# Patient Record
Sex: Male | Born: 1980
Health system: Southern US, Community
[De-identification: ages and names within clinical notes are randomized; demographics above are authoritative.]

## PROBLEM LIST (undated history)

## (undated) DIAGNOSIS — G8929 Other chronic pain: Secondary | ICD-10-CM

## (undated) DIAGNOSIS — M549 Dorsalgia, unspecified: Secondary | ICD-10-CM

## (undated) DIAGNOSIS — K5790 Diverticulosis of intestine, part unspecified, without perforation or abscess without bleeding: Secondary | ICD-10-CM

## (undated) HISTORY — PX: BACK SURGERY: SHX140

---

## 2000-06-09 ENCOUNTER — Encounter: Admission: RE | Admit: 2000-06-09 | Discharge: 2000-09-07 | Payer: Self-pay | Admitting: Anesthesiology

## 2001-12-17 ENCOUNTER — Emergency Department (HOSPITAL_COMMUNITY): Admission: EM | Admit: 2001-12-17 | Discharge: 2001-12-17 | Payer: Self-pay | Admitting: Emergency Medicine

## 2001-12-24 ENCOUNTER — Emergency Department (HOSPITAL_COMMUNITY): Admission: EM | Admit: 2001-12-24 | Discharge: 2001-12-24 | Payer: Self-pay | Admitting: Emergency Medicine

## 2004-03-23 ENCOUNTER — Emergency Department (HOSPITAL_COMMUNITY): Admission: EM | Admit: 2004-03-23 | Discharge: 2004-03-23 | Payer: Self-pay | Admitting: Emergency Medicine

## 2008-09-27 ENCOUNTER — Ambulatory Visit (HOSPITAL_COMMUNITY): Admission: RE | Admit: 2008-09-27 | Discharge: 2008-09-27 | Payer: Self-pay | Admitting: Pediatrics

## 2008-10-09 ENCOUNTER — Ambulatory Visit (HOSPITAL_COMMUNITY): Admission: RE | Admit: 2008-10-09 | Discharge: 2008-10-09 | Payer: Self-pay | Admitting: Pediatrics

## 2008-11-14 ENCOUNTER — Encounter (INDEPENDENT_AMBULATORY_CARE_PROVIDER_SITE_OTHER): Payer: Self-pay | Admitting: Orthopedic Surgery

## 2008-11-15 ENCOUNTER — Inpatient Hospital Stay (HOSPITAL_COMMUNITY): Admission: AD | Admit: 2008-11-15 | Discharge: 2008-11-16 | Payer: Self-pay | Admitting: Orthopedic Surgery

## 2009-05-22 IMAGING — CR DG OR PORTABLE SPINE
1 series · 1 of 1 positions shown · non-contrast
Comparison: 7188 hours the same day.

CLINICAL DATA: 27-year-old male undergoing lumbar surgery.

PORTABLE SPINE

[view not recorded]
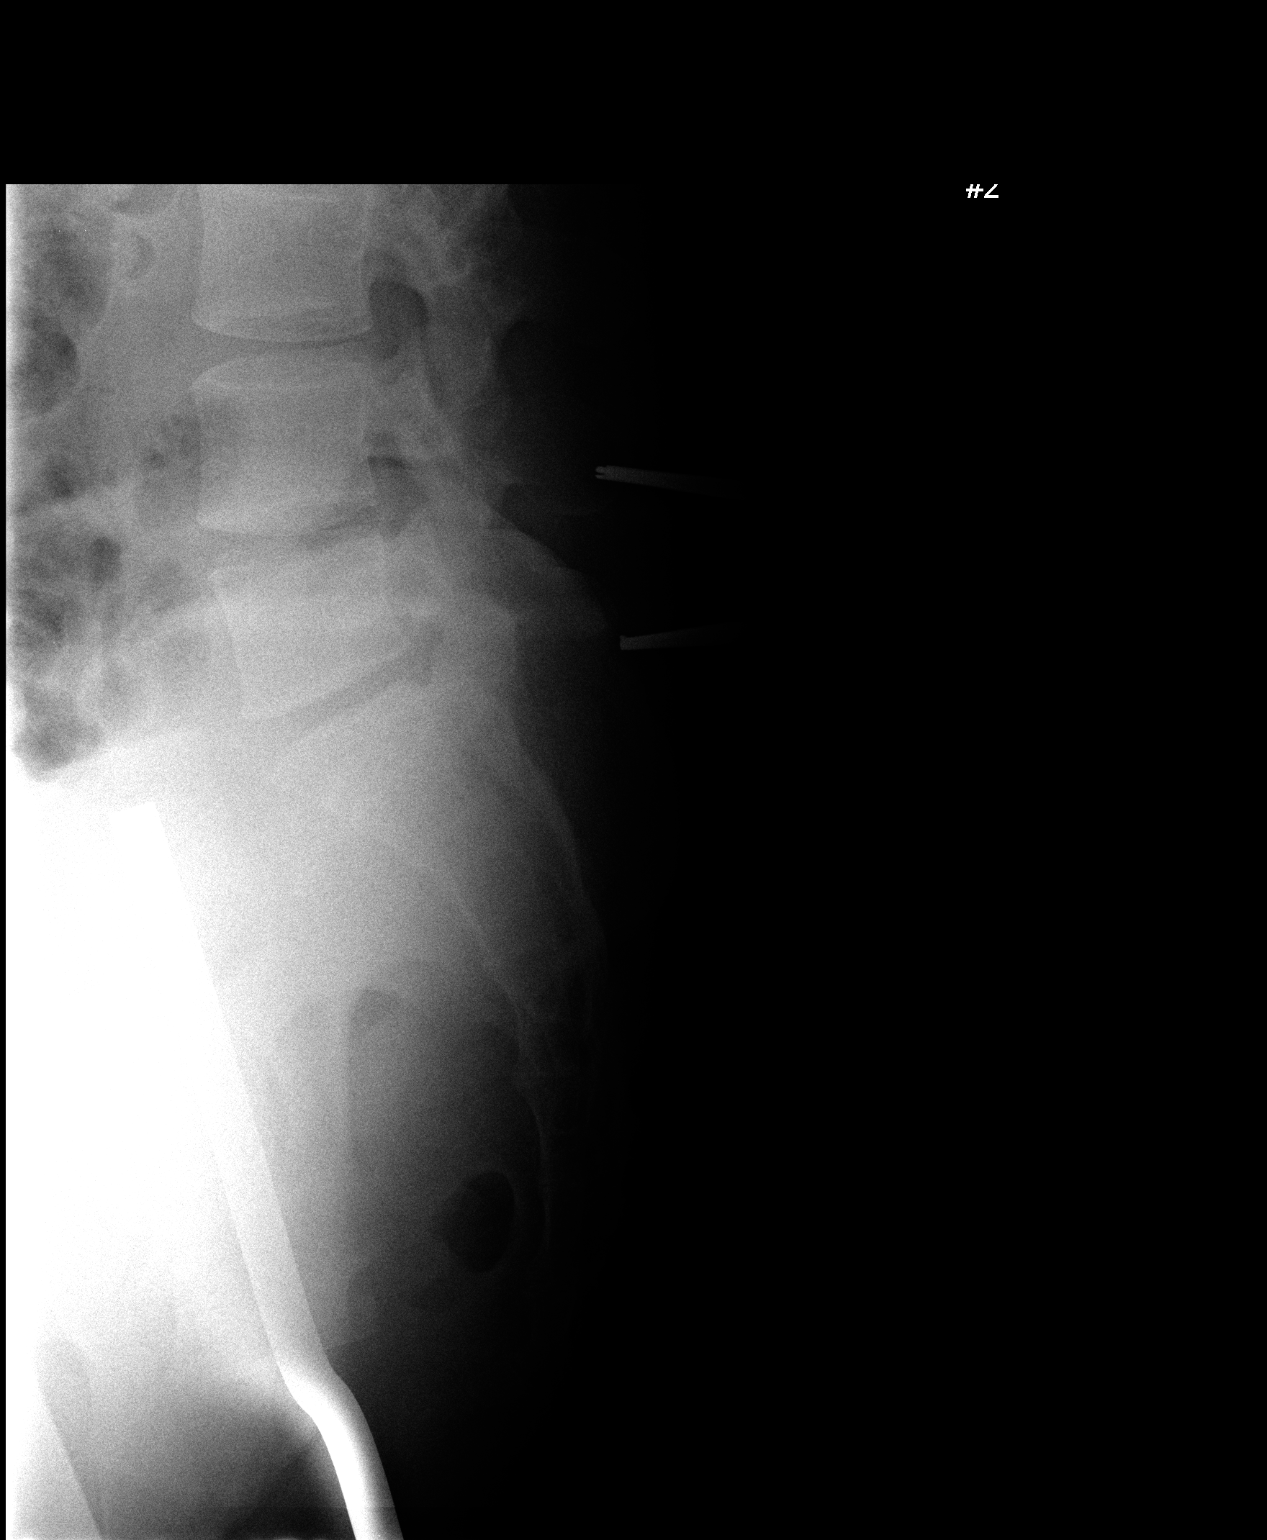

[1 of 1 positions shown; findings below may reference images not displayed]

FINDINGS: Portable cross-table lateral intraoperative view of the
lumbar spine labeled #2 at 0405 hours.

Assuming five lumbar type vertebral bodies, surgical clamps are
seen on the L4 spinous process, and near the L5 spinous process.
These approximate the L4-L5 and L5-L1 disc space levels,
respectively.
IMPRESSION: Intraoperative localization as above.

## 2009-05-22 IMAGING — CR DG OR PORTABLE SPINE
1 series · 1 of 1 positions shown · non-contrast
Comparison: None.

CLINICAL DATA: Low back pain.  Intraoperative spine film request
for annotation.

PORTABLE SPINE

[view not recorded]
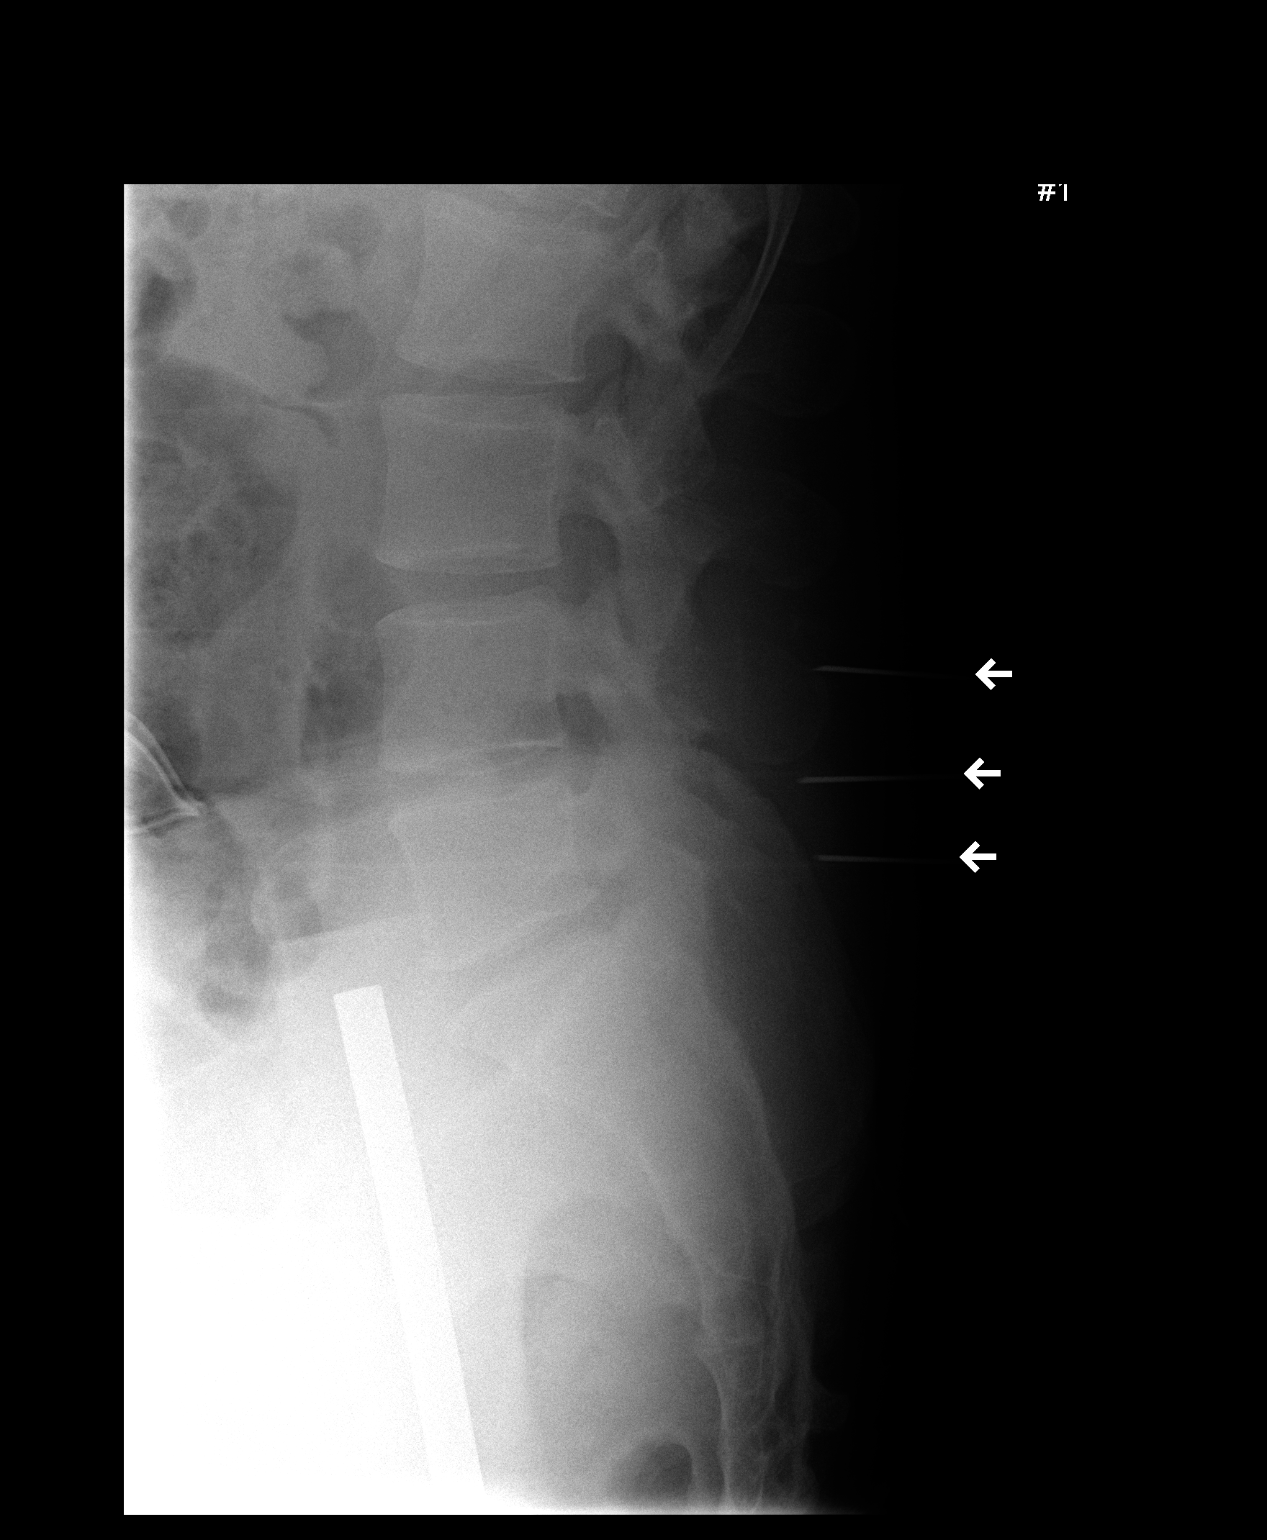

[1 of 1 positions shown; findings below may reference images not displayed]

FINDINGS: Five lumbar type vertebral bodies are assumed.  I have no
correlative cross sectional imaging or plain films.

Three needles have been placed from a posterior approach directed
toward the lower lumbar spine.

The upper needle tip is adjacent to the superior aspect of the
spinous process of L4.

The middle needle tip lies between the spinous processes of L4 and
L5.

The lower needle tip appears to be adjacent to the inferior aspect
of the spinous process of L5.
IMPRESSION: As above

## 2011-01-26 NOTE — Op Note (Signed)
[name], [name]                  ACCOUNT NO.:  [id number]   MEDICAL RECORD NO.:  [id number]          PATIENT TYPE:  OIB   LOCATION:  1617                         FACILITY:  [facility]   PHYSICIAN:  Marlowe Kays, M.D.  DATE OF BIRTH:  1981/02/05   DATE OF PROCEDURE:  11/14/2008  DATE OF DISCHARGE:                               OPERATIVE REPORT   PREOPERATIVE DIAGNOSES:  1. Herniated nucleus pulposus L4-5 left with L5 neuropathy.  2. Herniated nucleus pulposus L5-S1 right with S1 neuropathy.   OPERATION:  Microdiskectomy L4-5 left and L5-S1 right.   SURGEON:  Dr. Simonne Come.   ASSISTANT:  Dr. Worthy Rancher.   ANESTHESIA:  General.   PATHOLOGY AND JUSTIFICATION FOR PROCEDURE:  He had, had a few week  history of progressive, mainly left lower extremity pain associated with  both L5 and S1 neuropathy with an MRI on November 07, 2008,  demonstrating large disk herniations in both areas.  For some reason his  neurologic abnormalities were in the left lower extremity only, but his  MRI demonstrated a large disk herniation and L5-S1 on the right.  Also  at surgery, he was found to have numerous large fragments beneath the L5  nerve root on the left.   DESCRIPTION OF PROCEDURE:  Prophylactic antibiotics, satisfactory  general anesthesia, knee-chest position on the Andrews frame, the back  was prepped with DuraPrep and draped in a sterile field.  A timeout  performed.  With three needles and a lateral x-ray, I tentatively  located the L4 to sacrum interval.  I then made my initial incision  based on this and tagged the two spinous processes which I thought were  L4-L5, and a lateral x-ray confirmed that this was the case.  Based of  this, we dissected the soft tissue off the lamina of L4-L5 and placed  two self-retaining McCullough retractors, working first at L5-S1 on the  right.  I used a double-action rongeur to remove bone from the inferior  portion of the lamina of L5.  We then  brought in the microscope and  continued exposing the interspace.  We placed a small curette beneath  the sacrum and followed this with a 2-mm Kerrison rongeur, removing bone  and ligamentum flavum.  With a combination of 2-3 mm Kerrison rongeurs  decompressed the interspace and decompressed the interlaminar space.  The S1 nerve root was identified and was found to be quite tight.  With  then went to L4-5 and performed a similar decompression.  I then used  two Penfield IV instruments to further localize the disk spaces.  We  returned the L5-S1.  We located the L5-S1 disk and opened it with a 15  knife blade.  I removed large chunks of disk material which were very  stringy and adherent.  Following removal of all disk material from the  interspace, the S1 nerve root was freely mobile.  I irrigated the wound  well and placed Gelfoam soaked in thrombin over the interspace and  around the nerve and dura.  I then went to L4-5 again.  The  L5 nerve  root was very adherent with a large element of lateral recess stenosis.  After decompressing this enough laterally so that we were able to locate  the L4-5 interspace, I opened this with a 15 knife blade.  There was a  lot of reactive tissue present.  We obtained a small amount of disk  material.  MRI had demonstrated disk material which had been expelled  inferiorly and we looked over the body of L5 and found a large amount of  disk material, again in big chunks lying beneath the L5 nerve root.  After removing numerous fragments and doing a wide distal foraminotomy,  no further disk material was evident and the L5 nerve root was freely  mobile.  I then irrigated the wound as well here with Gelfoam.  On  closure, he had a steady ooze from no specific area and accordingly I  placed a quarter-inch Penrose drain with safety pin through the right  inferior subcutaneous tissue and carefully closed the drain under direct  visualization so as not to spear  it with suture with interrupted #1  Vicryl in the fascia and deep subcutaneous tissue, 2-0 Vicryl in the  superficial subcutaneous tissue and staples in the skin.  Betadine  Adaptic dry sterile dressing were applied.  He tolerated the procedure  well.  He was gently placed on his PACU bed and taken to the recovery in  satisfactory condition with no known complications.  Estimated blood  loss was perhaps 250 mL.  No dural tears, no known complications.           ______________________________  Marlowe Kays, M.D.     JA/MEDQ  D:  11/14/2008  T:  11/14/2008  Job:  [id number]

## 2011-01-29 NOTE — H&P (Signed)
[facility]  Patient:    STERLING, MONDO                           MRN: [id number] Adm. Date:  [id number] Attending:  Thyra Breed CC:         Julio Sicks, M.D.   History and Physical  FOLLOWUP EVALUATION  Mr. Khachatryan comes in for followup evaluation an possibly his third injection today.  He has noted no improvement since his previous two injections and has the same symptoms as when he presented.  I advised him there was really no need to have another injection today.  His medications are unchanged.  PHYSICAL EXAMINATION:  VITAL SIGNS:  Blood pressure 133/72, heart rate 73, respiratory rate 10, O2 saturation 99%, pain level 8/10, temperature 97.3.  NEUROLOGIC:  He exhibited symmetric deep tendon reflexes of the knees and ankles with negative straight leg raise signs today.  His gait is intact.  IMPRESSION:  Low back pain with history of central focal disk herniation at L4-5 and spinal stenosis with no improvement to two lumbar epidural steroid injections.  DISPOSITION:  I advised the patient there was no need to do a third injection today and recommended that he go ahead and followup with Dr. Jordan Likes. DD:  06/24/00 TD:  06/24/00 Job: [id number]

## 2011-01-29 NOTE — Procedures (Signed)
[facility]  Patient:    [name], [name]                           MRN: [id number] Proc. Date: 06/10/00 Adm. Date:  [id number] Attending:  Thyra Breed CC:         Dr. Molli Hazard  Dr. Felecia Shelling  Dr. Julio Sicks   Procedure Report  PROCEDURE:  Lumbar epidural steroid injection.  HISTORY OF PRESENT ILLNESS:  [name] is a 30 year old who is sent to [place] by Dr. Jordan Likes for a series of lumbar epidural steroid injections. The patient states that he was in his usual state of health up until approximately 7 months ago when he noted the ______ onset of lower back discomfort. With time, this pain became more diffuse radiating out into the left lower extremity across his lower back. He saw Dr. Felecia Shelling, his primary care physician, who obtained an MRI which apparently demonstrated a central disk herniation at L4-5 with moderate spinal stenosis. He was sent to Dr. Jordan Likes who evaluated him on September 13 and advocated a trial of epidural steroid injections. He presents today for this. He describes his pain as a deep throbbing, shooting, sharp, cramping discomfort radiating out into the left lower extremity predominantly. He denies any bowel or bladder incontinence although he has had some enuresis for the past 2 nights. He denied weakness. He does have persistent numbness and tingling out into the left foot involving the dorsum of the left foot to the great toe. He is currently on Tylenol and ibuprofen for this. He has been given celebrex but did not take it because somebody in his neighborhood died from celebrex. His pain is much worse by lifting or sitting for long periods of time or being stationary in one position too long. It is improved by laying on the floor with his legs flexed.  CURRENT MEDICATIONS:  Tylenol and ibuprofen.  ALLERGIES:  PENICILLIN and BEE STINGS.  FAMILY HISTORY:  Positive for diabetes, hypertension, cancer and emphysema.  PAST SURGICAL HISTORY:   Significant for urethral dilatation at age 74.  SOCIAL HISTORY:  The patient is a nonsmoker, nondrinker. He has been out of work due to his back by Dr. Felecia Shelling.  ACTIVE MEDICAL PROBLEMS:  He denied any active medical problems.  REVIEW OF SYSTEMS:  GENERAL:  Negative.  HEAD:  Significant for sinus problems and hay fever. ALLERGY:  He is allergic to bees and pollen. EYES: Negative. NOSE/MOUTH/THROAT:  Significant for nasal stuffiness. EARS:  Negative. PULMONARY: Significant for cough secondary to post nasal drip. CARDIOVASCULAR: Negative. GI: Negative. GU: Negative. MUSCULOSKELETAL: Negative. NEUROLOGIC: See HPI. HEMATOLOGIC:  Negative. ENDOCRINE: Negative. CUTANEOUS:  Negative. PSYCHIATRIC:  Negative.  PHYSICAL EXAMINATION:  VITAL SIGNS:  Blood pressure was 128/70, heart rate 70, respiratory rate 10, O2 saturations 99%, pain level is 8/10 and temperature is 98.2.  HEENT:  Head was normocephalic, atraumatic. Eyes, extraocular movements intact. Conjunctivae and sclerae clear. Nose patent nares without discharge. Oropharynx was free of lesions.  NECK:  Supple without lymphadenopathy.  LUNGS:  Clear.  HEART:  Regular rate and rhythm.  ABDOMEN:  Bowel sounds present.  GENITALIA/RECTAL:  Not performed.  BACK:  Revealed positive straight leg raise signs bilaterally at about 60 degrees. He is tender over his back over the spinous processes in the lumbar region.  EXTREMITIES:  No cyanosis, clubbing or edema with radial pulses and dorsalis pedis pulse were 2+ and symmetric.  NEUROLOGIC:  The patient  was oriented x 4. Cranial nerves II-XII are grossly intact. Deep tendon reflexes were symmetric in the upper and lower extremity with downgoing toes. Sensation exam to scratch and and vibratory sense were intact.  Coordination was grossly intact.  IMPRESSION: 1. Low back pain with history of central focal disk herniation at L4-5 of    marked spinal stenosis. 2. Hay  fever.  DISPOSITION:  I discussed the potential risks, benefits and limitations of a lumbar epidural steroid injection as well as the side effects of corticosteroids with the patient and his mother. He wishes to proceed. His questions were answered.  DESCRIPTION OF PROCEDURE:  After informed consent was obtained, the patient was placed in the sitting position and monitored. The patients back was prepped with Betadine x 3. A skin wheal was raised at the L4-5 interspace with 1 percent lidocaine. A 20 gauge Tuohy needle was introduced to the lumbar epidural space to loss of resistance to preservative free normal saline. There was no cerebrospinal fluid nor blood. 80 mg of Medrol and 7 ml of preservative free normal saline was injected. The needle was flushed with preservative free normal saline and removed intact. The patient developed some mild vagal symptoms following the injection and was laid on his left side and recuperated quickly.  CONDITION POST PROCEDURE:  Stable.  DISCHARGE INSTRUCTIONS:  Resume previous diet. Limitations in activities per instruction sheet as outlined by my assistant today. Continue on current medications. Follow-up with me in 1-2 weeks for a second injection. DD:  06/10/00 TD:  06/10/00 Job: [id number]

## 2011-01-29 NOTE — Procedures (Signed)
[facility]  Patient:    [name], [name]                           MRN: [id number] Proc. Date: 06/17/00 Adm. Date:  [id number] Attending:  Thyra Breed CC:         Julio Sicks, M.D.   Procedure Report  PROCEDURE:  Lumbar epidural steroid injection.  DIAGNOSIS:  Herniated nucleus pulposus at L4-L5 centrally with marked spinal stenosis.  INTERVAL HISTORY:  The patient has not noted a great deal of improvement after the first injection.  He felt a little bit out of it the first day, but he has just not noticed any improvement.  He continues on Tylenol and ibuprofen p.r.n.  PHYSICAL EXAMINATION:  Blood pressure 136/67, heart rate 63, respiratory rate 16, O2 saturations 98%.  Pain level is 8/10, and temperature is 98.1.  His back shows good healing from his previous injection site.  Neuro exam is grossly unchanged.  DESCRIPTION OF PROCEDURE:  After informed consent was obtained, the patient was placed in the sitting position and monitored.  His back was prepped with Betadine x 3.  A skin wheal was raised at the L4-L5 interspace with 1% lidocaine.  A 20 gauge Tuohy needle was introduced to the lumbar epidural space to loss of resistance to preservative-free normal saline.  There was no CSF nor blood.  Medrol 80 mg in 8 mL of preservative-free normal saline was gently injected.  The needle was flushed with preservative-free normal saline and removed intact.  Postprocedure condition - stable.  DISCHARGE INSTRUCTIONS: 1. Resume previous diet. 2. Limitations on activities per instruction sheet, as reviewed by my    assistant today. 3. Continue on current medications. 4. Follow up with me in 1-2 weeks for repeat injection. DD:  06/17/00 TD:  06/17/00 Job: [id number]

## 2011-12-01 ENCOUNTER — Encounter (HOSPITAL_COMMUNITY): Payer: Self-pay

## 2011-12-01 ENCOUNTER — Emergency Department (HOSPITAL_COMMUNITY)
Admission: EM | Admit: 2011-12-01 | Discharge: 2011-12-02 | Disposition: A | Discharge status: Home / Self Care | Payer: Self-pay | Attending: Emergency Medicine | Admitting: Emergency Medicine

## 2011-12-01 DIAGNOSIS — G8929 Other chronic pain: Secondary | ICD-10-CM | POA: Insufficient documentation

## 2011-12-01 DIAGNOSIS — R209 Unspecified disturbances of skin sensation: Secondary | ICD-10-CM | POA: Insufficient documentation

## 2011-12-01 DIAGNOSIS — IMO0002 Reserved for concepts with insufficient information to code with codable children: Secondary | ICD-10-CM | POA: Insufficient documentation

## 2011-12-01 DIAGNOSIS — M549 Dorsalgia, unspecified: Secondary | ICD-10-CM | POA: Insufficient documentation

## 2011-12-01 DIAGNOSIS — M5416 Radiculopathy, lumbar region: Secondary | ICD-10-CM

## 2011-12-01 HISTORY — DX: Dorsalgia, unspecified: M54.9

## 2011-12-01 HISTORY — DX: Other chronic pain: G89.29

## 2011-12-01 NOTE — ED Notes (Signed)
Chronic back pain that radiates to feet.

## 2011-12-02 ENCOUNTER — Emergency Department (HOSPITAL_COMMUNITY): Payer: Self-pay

## 2011-12-02 MED ORDER — METHOCARBAMOL 500 MG PO TABS
1000.0000 mg | ORAL_TABLET | Freq: Once | ORAL | Status: AC
  Administered 2011-12-02: 1000 mg via ORAL
  Filled 2011-12-02: qty 2

## 2011-12-02 MED ORDER — OXYCODONE-ACETAMINOPHEN 5-325 MG PO TABS: 1.0000 | ORAL_TABLET | ORAL | Status: AC | PRN

## 2011-12-02 MED ORDER — HYDROMORPHONE HCL PF 1 MG/ML IJ SOLN
1.0000 mg | Freq: Once | INTRAMUSCULAR | Status: AC
  Administered 2011-12-02: 1 mg via INTRAMUSCULAR
  Filled 2011-12-02: qty 1

## 2011-12-02 MED ORDER — PREDNISONE 10 MG PO TABS: ORAL_TABLET | ORAL | Status: DC

## 2011-12-02 MED ORDER — METHOCARBAMOL 500 MG PO TABS: 1000.0000 mg | ORAL_TABLET | Freq: Four times a day (QID) | ORAL | Status: AC

## 2011-12-02 MED ORDER — PREDNISONE 20 MG PO TABS
60.0000 mg | ORAL_TABLET | Freq: Once | ORAL | Status: AC
  Administered 2011-12-02: 60 mg via ORAL
  Filled 2011-12-02: qty 3

## 2011-12-02 NOTE — ED Notes (Signed)
Discharge instructions reviewed with pt, questions answered. Pt verbalized understanding.  

## 2011-12-02 NOTE — ED Notes (Signed)
PA at bedside to assess pt.

## 2011-12-02 NOTE — Discharge Instructions (Signed)
Lumbosacral Radiculopathy Lumbosacral radiculopathy is a pinched nerve or nerves in the low back (lumbosacral area). When this happens you may have weakness in your legs and may not be able to stand on your toes. You may have pain going down into your legs. There may be difficulties with walking normally. There are many causes of this problem. Sometimes this may happen from an injury, or simply from arthritis or boney problems. It may also be caused by other illnesses such as diabetes. If there is no improvement after treatment, further studies may be done to find the exact cause. DIAGNOSIS  X-rays may be needed if the problems become long standing. Electromyograms may be done. This study is one in which the working of nerves and muscles is studied. HOME CARE INSTRUCTIONS   Applications of ice packs may be helpful. Ice can be used in a plastic bag with a towel around it to prevent frostbite to skin. This may be used every 2 hours for 20 to 30 minutes, or as needed, while awake, or as directed by your caregiver.   Only take over-the-counter or prescription medicines for pain, discomfort, or fever as directed by your caregiver.   If physical therapy was prescribed, follow your caregiver's directions.  SEEK IMMEDIATE MEDICAL CARE IF:   You have pain not controlled with medications.   You seem to be getting worse rather than better.   You develop increasing weakness in your legs.   You develop loss of bowel or bladder control.   You have difficulty with walking or balance, or develop clumsiness in the use of your legs.   You have a fever.  MAKE SURE YOU:   Understand these instructions.   Will watch your condition.   Will get help right away if you are not doing well or get worse.  Document Released: 08/30/2005 Document Revised: 08/19/2011 Document Reviewed: 04/19/2008 [facility] Patient Information 2012 [place], [place].   Take your next dose of prednisone tomorrow evening.    Use the  the other medicines as directed.  Do not drive within 4 hours of taking oxycodone as this will make you drowsy.  Avoid lifting,  Bending,  Twisting or any other activity that worsens your pain over the next week.  Apply an  Heating pad  to your lower back for 20 minutes 4 times daily.  You should get rechecked if your symptoms are not better over the next 5 days,  Or you develop increased pain,  Weakness in your leg(s) or loss of bladder or bowel function - these are symptoms of a worsening condition.

## 2011-12-02 NOTE — ED Provider Notes (Signed)
History     CSN: [id number]  Arrival date & time 12/01/11  2148   First MD Initiated Contact with Patient 12/01/11 2323      Chief Complaint  Patient presents with  . Back Pain    (Consider location/radiation/quality/duration/timing/severity/associated sxs/prior treatment) HPI Comments: Patient presents with lower back pain which started about 3 days ago and radiates into his right heel.  He has a history of a prior discectomy surgery about 3 years ago, and has had daily low-grade discomfort, which has rapidly progressed over the past 3 days.  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  He denies any weakness in his lower extremities but describes a burning and needlelike pain that radiates down the back of his leg and ends at his right heel.  He has had no urinary or bowel incontinence or retention.  Patient is a 31 y.o. male presenting with back pain. The history is provided by the patient.  Back Pain  The pain is present in the lumbar spine. The quality of the pain is described as stabbing, shooting and burning. The pain is at a severity of 10/10. The pain is severe. The symptoms are aggravated by bending, twisting and certain positions. Associated symptoms include paresthesias and tingling. Pertinent negatives include no chest pain, no fever, no numbness, no headaches, no abdominal pain, no bowel incontinence, no perianal numbness, no bladder incontinence, no paresis and no weakness. He has tried NSAIDs for the symptoms. The treatment provided no relief.    Past Medical History  Diagnosis Date  . Back pain, chronic     Past Surgical History  Procedure Date  . Back surgery     No family history on file.  History  Substance Use Topics  . Smoking status: Never Smoker   . Smokeless tobacco: Not on file  . Alcohol Use: No      Review of Systems  Constitutional:  Negative for fever.  HENT: Negative for congestion, sore throat and neck pain.   Eyes: Negative.   Respiratory: Negative for chest tightness and shortness of breath.   Cardiovascular: Negative for chest pain.  Gastrointestinal: Negative for nausea, abdominal pain and bowel incontinence.  Genitourinary: Negative.  Negative for bladder incontinence.  Musculoskeletal: Positive for back pain. Negative for myalgias, joint swelling and arthralgias.  Skin: Negative.  Negative for rash and wound.  Neurological: Positive for tingling and paresthesias. Negative for dizziness, weakness, light-headedness, numbness and headaches.  Hematological: Negative.   Psychiatric/Behavioral: Negative.     Allergies  Bee venom; Honey; and Penicillins  Home Medications   Current Outpatient Rx  Name Route Sig Dispense Refill  . IBUPROFEN 200 MG PO TABS Oral Take 800 mg by mouth as needed. For pain    . METHOCARBAMOL 500 MG PO TABS Oral Take 2 tablets (1,000 mg total) by mouth 4 (four) times daily. 20 tablet 0  . OXYCODONE-ACETAMINOPHEN 5-325 MG PO TABS Oral Take 1 tablet by mouth every 4 (four) hours as needed for pain. 20 tablet 0  . PREDNISONE 10 MG PO TABS  6, 5, 4, 3, 2 then 1 tablet by mouth daily for 6 days total. 21 tablet 0    BP 110/75  Pulse 78  Temp(Src) 97.8 F (36.6 C) (Oral)  Resp 16  Ht 5\' 10"  (1.778 m)  Wt 243 lb 9.6 oz (110.496 kg)  BMI 34.95  kg/m2  SpO2 99%  Physical Exam  Nursing note and vitals reviewed. Constitutional: He is oriented to person, place, and time. He appears well-developed and well-nourished.  HENT:  Head: Normocephalic.  Eyes: Conjunctivae are normal.  Neck: Normal range of motion. Neck supple.  Cardiovascular: Regular rhythm and intact distal pulses.        Pedal pulses normal.  Pulmonary/Chest: Effort normal. He has no wheezes.  Abdominal: Soft. Bowel sounds are normal. He exhibits no distension and no mass.  Musculoskeletal: Normal range of motion. He  exhibits no edema.       Lumbar back: He exhibits tenderness. He exhibits no swelling, no edema and no spasm.  Neurological: He is alert and oriented to person, place, and time. He has normal strength. He displays no atrophy and no tremor. No sensory deficit. Gait normal.  Reflex Scores:      Patellar reflexes are 1+ on the right side and 1+ on the left side.      Achilles reflexes are 2+ on the right side and 2+ on the left side.      No strength deficit noted in hip and knee flexor and extensor muscle groups.  Ankle flexion and extension intact.  Patient does have increased pain with attempts at resisted extension of the knee joint.  Patellar reflexes bilaterally are reduced at 1+ but equal.  Skin: Skin is warm and dry.  Psychiatric: He has a normal mood and affect.    ED Course  Procedures (including critical care time)  Labs Reviewed - No data to display Dg Lumbar Spine Complete  12/02/2011  *RADIOLOGY REPORT*  Clinical Data: Back pain  LUMBAR SPINE - COMPLETE 4+ VIEW  Comparison: 11/14/2008  Findings:  Normal alignment of the lumbar spine.  The vertebral body heights are well preserved.  There is disc space narrowing and mild spurring at the L5-S1 level.  No fractures or subluxations identified.  IMPRESSION:  1.  No acute findings. 2.  Mild L5-S1 degenerative disc disease.  Original Report Authenticated By: Rosealee Albee, M.D.     1. Lumbar radiculopathy, acute    While in the department patient was treated with hydromorphone 1 mg IM, prednisone 60 mg by mouth and Robaxin 1 g by mouth.  He had improved pain in his lower back, but continued to have pain radiating to his right heel.  Hydromorphone 1 mg IM repeated x1 with significant improvement.  Patient was able to ambulate prior to discharge home with no antalgic gait or significant pain.   MDM  Patient with acute on chronic lumbosacral pain with new radiculopathy.  No exam findings consistent with a emergent condition.  X-rays  reviewed.  Robaxin, prednisone and Percocet prescribed.  Patient has been referred to Drs. Wynetta Emery for further evaluation and management if symptoms are not improved over the next 4-5 days with conservative treatment including minimizing of lifting bending twisting activities, heat therapy and medications per above.  He was strongly encouraged to get rechecked immediately for any weakness in his lower extremities or the development of bowel or bladder problems.        Candis Musa, PA 12/02/11 0246

## 2011-12-02 NOTE — ED Provider Notes (Signed)
Medical screening examination/treatment/procedure(s) were performed by non-physician practitioner and as supervising physician I was immediately available for consultation/collaboration.   Celene Kras, MD 12/02/11 [phone]

## 2011-12-16 ENCOUNTER — Emergency Department (HOSPITAL_COMMUNITY)
Admission: EM | Admit: 2011-12-16 | Discharge: 2011-12-16 | Disposition: A | Discharge status: Home / Self Care | Payer: Self-pay | Attending: Emergency Medicine | Admitting: Emergency Medicine

## 2011-12-16 ENCOUNTER — Encounter (HOSPITAL_COMMUNITY): Payer: Self-pay | Admitting: *Deleted

## 2011-12-16 DIAGNOSIS — M5430 Sciatica, unspecified side: Secondary | ICD-10-CM | POA: Insufficient documentation

## 2011-12-16 DIAGNOSIS — M549 Dorsalgia, unspecified: Secondary | ICD-10-CM

## 2011-12-16 MED ORDER — OXYCODONE-ACETAMINOPHEN 5-325 MG PO TABS: 1.0000 | ORAL_TABLET | ORAL | Status: AC | PRN

## 2011-12-16 MED ORDER — METHOCARBAMOL 500 MG PO TABS
1000.0000 mg | ORAL_TABLET | Freq: Once | ORAL | Status: AC
  Administered 2011-12-16: 1000 mg via ORAL
  Filled 2011-12-16: qty 2

## 2011-12-16 MED ORDER — HYDROMORPHONE HCL PF 1 MG/ML IJ SOLN
1.0000 mg | Freq: Once | INTRAMUSCULAR | Status: AC
  Administered 2011-12-16: 1 mg via INTRAMUSCULAR
  Filled 2011-12-16: qty 1

## 2011-12-16 MED ORDER — PREDNISONE 10 MG PO TABS: ORAL_TABLET | ORAL | Status: DC

## 2011-12-16 MED ORDER — CYCLOBENZAPRINE HCL 10 MG PO TABS: 10.0000 mg | ORAL_TABLET | Freq: Three times a day (TID) | ORAL | Status: AC | PRN

## 2011-12-16 NOTE — Discharge Instructions (Signed)
Sciatica  Sciatica is a condition often seen in patients with disk disease of the lower back. Pressure on the sciatica nerve causes pain to radiate from the lower back or buttock down the leg.   CAUSES   It results from pressure on nerve roots coming out of the spine. This is often the result of a disc that deteriorates and pushes to one side. Often there is a history of back problems.   TREATMENT   In most cases sciatica improves greatly with conservative treatment (treatment that does not involve surgery). Most patients are completely better after 2-4 weeks of bed rest and other supportive care. Bed rest reduces the disc pressure greatly. Sitting is the worst position. When sitting pressure on the disc is over 5 times greater than it is while lying down. Avoid:   Bending.   Lifting.   All other activities which make the problem worse.  After the pain improves, you may continue with normal activity. Take brief periods for bed rest throughout the day until you are back to normal.  Only take over-the-counter or prescription medicines for pain, discomfort, or fever as directed by your caregiver. Muscle relaxants may help by relieving spasm and providing mild sedation. Cold or heat therapy and massage may also give significant relief. Spinal manipulation is not recommended because it can increase the degree of disc protrusion. Traction can be used in severe cases. Surgery is reserved for patients who:   Do not improve within the first months of conservative treatment.   Have signs of severe nerve root pressure.  See your doctor for follow up care as recommended. A program for back injury rehabilitation with stretching and strengthening exercises is an important part of healing.   SEEK MEDICAL CARE IF:    You notice increased pain.   You notice weakness.   You have numbness in your legs.   You have any difficulty with bladder or bowel control.  Document Released: 10/07/2004 Document Revised: 08/19/2011 Document  Reviewed: 08/30/2005  [employer] Patient Information 2012 [employer].

## 2011-12-16 NOTE — ED Notes (Signed)
Low back pain chronic, , and down rt leg,  Has appt with surgeon for treatment.

## 2011-12-21 NOTE — ED Provider Notes (Signed)
History     CSN: [id number]  Arrival date & time 12/16/11  1506   First MD Initiated Contact with Patient 12/16/11 1523      Chief Complaint  Patient presents with  . Back Pain    (Consider location/radiation/quality/duration/timing/severity/associated sxs/prior treatment) Patient is a 31 y.o. male presenting with back pain. The history is provided by the patient.  Back Pain  This is a chronic problem. The current episode started more than 1 week ago. The problem occurs constantly. The problem has been gradually worsening. The pain is associated with twisting (certain movements). The pain is present in the lumbar spine. The quality of the pain is described as shooting and aching. The pain radiates to the right thigh. The pain is moderate. The symptoms are aggravated by certain positions, twisting and bending. The pain is the same all the time. Associated symptoms include leg pain. Pertinent negatives include no chest pain, no fever, no numbness, no abdominal pain, no abdominal swelling, no bowel incontinence, no perianal numbness, no bladder incontinence, no dysuria, no pelvic pain, no paresthesias, no paresis, no tingling and no weakness. He has tried NSAIDs, muscle relaxants, heat and ice for the symptoms. The treatment provided no relief.    Past Medical History  Diagnosis Date  . Back pain, chronic     Past Surgical History  Procedure Date  . Back surgery     History reviewed. No pertinent family history.  History  Substance Use Topics  . Smoking status: Never Smoker   . Smokeless tobacco: Not on file  . Alcohol Use: No      Review of Systems  Constitutional: Negative for fever.  Cardiovascular: Negative for chest pain.  Gastrointestinal: Negative for nausea, vomiting, abdominal pain and bowel incontinence.  Genitourinary: Negative for bladder incontinence, dysuria, hematuria, flank pain and pelvic pain.  Musculoskeletal: Positive for back pain and gait problem.  Negative for joint swelling.  Skin: Negative.   Neurological: Negative for dizziness, tingling, weakness, numbness and paresthesias.  All other systems reviewed and are negative.    Allergies  Bee venom; Honey; and Penicillins  Home Medications   Current Outpatient Rx  Name Route Sig Dispense Refill  . ACETAMINOPHEN 325 MG PO TABS Oral Take 1,300 mg by mouth once as needed. For pain    . CYCLOBENZAPRINE HCL 10 MG PO TABS Oral Take 1 tablet (10 mg total) by mouth 3 (three) times daily as needed for muscle spasms. 21 tablet 0  . OXYCODONE-ACETAMINOPHEN 5-325 MG PO TABS Oral Take 1 tablet by mouth every 4 (four) hours as needed for pain. 15 tablet 0  . PREDNISONE 10 MG PO TABS  Take 6 tablets day one, 5 tablets day two, 4 tablets day three, 3 tablets day four, 2 tablets day five, then 1 tablet day six 21 tablet 0    BP 135/96  Pulse 87  Temp(Src) 97.8 F (36.6 C) (Oral)  Resp 20  Ht 5\' 9"  (1.753 m)  Wt 240 lb (108.863 kg)  BMI 35.44 kg/m2  SpO2 99%  Physical Exam  Nursing note and vitals reviewed. Constitutional: He is oriented to person, place, and time. He appears well-developed and well-nourished. No distress.  HENT:  Head: Normocephalic and atraumatic.  Cardiovascular: Normal rate, regular rhythm, normal heart sounds and intact distal pulses.   No murmur heard. Pulmonary/Chest: Effort normal and breath sounds normal. No respiratory distress.  Abdominal: Soft. He exhibits no distension. There is no tenderness.  Musculoskeletal: Normal range of motion. He  exhibits no tenderness.       Lumbar back: He exhibits tenderness. He exhibits normal range of motion, no bony tenderness, no swelling, no laceration and normal pulse.       Back:  Neurological: He is alert and oriented to person, place, and time. No sensory deficit. He exhibits normal muscle tone. Coordination normal.  Reflex Scores:      Patellar reflexes are 2+ on the right side and 2+ on the left side.      Achilles  reflexes are 2+ on the right side and 2+ on the left side. Skin: Skin is warm and dry.    ED Course  Procedures (including critical care time)    1. Back pain   2. Sciatica       MDM    Previous ED charts and nursing notes were reviewed by me.    Pt has ttp of the lumbar paraspinal muscles.  No focal neuro deficits on exam.  Ambulates with a slow, but steady gait  Pt has appt with a neurosurgeon, IM dilaudid and muscle relaxer given , pt agrees to close follow-up.  Advised that ER cannot manage his chronic pain.    Patient / Family / Caregiver understand and agree with initial ED impression and plan with expectations set for ED visit. Pt stable in ED with no significant deterioration in condition. Pt feels improved after observation and/or treatment in ED.      [name], [place] 12/21/11 1357

## 2012-01-08 NOTE — ED Provider Notes (Signed)
Evaluation and management procedures were performed by the PA/NP/resident physician under my supervision/collaboration.   Treena Cosman D Jasdeep Kepner, MD 01/08/12 2010 

## 2013-07-25 ENCOUNTER — Encounter (HOSPITAL_COMMUNITY): Payer: Self-pay | Admitting: Emergency Medicine

## 2013-07-25 ENCOUNTER — Emergency Department (HOSPITAL_COMMUNITY)
Admission: EM | Admit: 2013-07-25 | Discharge: 2013-07-25 | Disposition: A | Discharge status: Home / Self Care | Payer: No Typology Code available for payment source | Attending: Emergency Medicine | Admitting: Emergency Medicine

## 2013-07-25 DIAGNOSIS — L255 Unspecified contact dermatitis due to plants, except food: Secondary | ICD-10-CM | POA: Insufficient documentation

## 2013-07-25 DIAGNOSIS — Z880 Allergy status to penicillin: Secondary | ICD-10-CM | POA: Insufficient documentation

## 2013-07-25 DIAGNOSIS — G8929 Other chronic pain: Secondary | ICD-10-CM | POA: Insufficient documentation

## 2013-07-25 MED ORDER — DIPHENHYDRAMINE HCL 25 MG PO CAPS
25.0000 mg | ORAL_CAPSULE | Freq: Once | ORAL | Status: AC
  Administered 2013-07-25: 25 mg via ORAL
  Filled 2013-07-25: qty 1

## 2013-07-25 MED ORDER — PREDNISONE 10 MG PO TABS: ORAL_TABLET | ORAL | Status: DC

## 2013-07-25 MED ORDER — DEXAMETHASONE SODIUM PHOSPHATE 10 MG/ML IJ SOLN
10.0000 mg | Freq: Once | INTRAMUSCULAR | Status: AC
  Administered 2013-07-25: 10 mg via INTRAMUSCULAR
  Filled 2013-07-25: qty 1

## 2013-07-25 NOTE — ED Notes (Signed)
Noted rash Monday morning per pt, denies N/V/D or fever

## 2013-07-25 NOTE — ED Notes (Signed)
Patient complaining of rash on inside of right elbow x 2 days. Also complaining of drainage from area.

## 2013-07-27 NOTE — ED Provider Notes (Signed)
Medical screening examination/treatment/procedure(s) were performed by non-physician practitioner and as supervising physician I was immediately available for consultation/collaboration.  EKG Interpretation   None         Glynn Octave, MD 07/27/13 2120

## 2013-07-27 NOTE — ED Provider Notes (Signed)
CSN: [id number]     Arrival date & time 07/25/13  1843 History   First MD Initiated Contact with Patient 07/25/13 1909     Chief Complaint  Patient presents with  . Rash   (Consider location/radiation/quality/duration/timing/severity/associated sxs/prior Treatment) Patient is a 32 y.o. male presenting with rash. The history is provided by the patient.  Rash Location:  Shoulder/arm Shoulder/arm rash location:  R elbow Quality: blistering, itchiness, redness and weeping   Quality: not draining, not painful and not swelling   Severity:  Mild Onset quality:  Gradual Duration:  2 days Timing:  Constant Progression:  Unchanged Chronicity:  New Context: plant contact   Relieved by:  Nothing Worsened by:  Nothing tried Ineffective treatments:  None tried Associated symptoms: no abdominal pain, no diarrhea, no fever, no headaches, no induration, no joint pain, no myalgias, no nausea, no shortness of breath, no sore throat, no throat swelling, no tongue swelling, no URI, not vomiting and not wheezing     Past Medical History  Diagnosis Date  . Back pain, chronic    Past Surgical History  Procedure Laterality Date  . Back surgery     History reviewed. No pertinent family history. History  Substance Use Topics  . Smoking status: Never Smoker   . Smokeless tobacco: Not on file  . Alcohol Use: Yes     Comment: occasionally    Review of Systems  Constitutional: Negative for fever, chills, activity change and appetite change.  HENT: Negative for facial swelling, sore throat and trouble swallowing.   Respiratory: Negative for chest tightness, shortness of breath and wheezing.   Gastrointestinal: Negative for nausea, vomiting, abdominal pain and diarrhea.  Musculoskeletal: Negative for arthralgias, myalgias, neck pain and neck stiffness.  Skin: Positive for rash. Negative for wound.       Rash to the right antecubital fossa  Neurological: Negative for dizziness, weakness, numbness  and headaches.  All other systems reviewed and are negative.    Allergies  Bee venom; Honey; and Penicillins  Home Medications   Current Outpatient Rx  Name  Route  Sig  Dispense  Refill  . acetaminophen (TYLENOL) 325 MG tablet   Oral   Take 1,300 mg by mouth once as needed. For pain         . predniSONE (DELTASONE) 10 MG tablet      Take 6 tablets day one, 5 tablets day two, 4 tablets day three, 3 tablets day four, 2 tablets day five, then 1 tablet day six   21 tablet   0   . predniSONE (DELTASONE) 10 MG tablet      Take 6 tablets day one, 5 tablets day two, 4 tablets day three, 3 tablets day four, 2 tablets day five, then 1 tablet day six   21 tablet   0    BP 117/74  Pulse 78  Temp(Src) 97.7 F (36.5 C) (Oral)  Resp 16  Ht 5\' 10"  (1.778 m)  Wt 245 lb (111.131 kg)  BMI 35.15 kg/m2  SpO2 96% Physical Exam  Nursing note and vitals reviewed. Constitutional: He is oriented to person, place, and time. He appears well-developed and well-nourished. No distress.  HENT:  Head: Normocephalic and atraumatic.  Mouth/Throat: Oropharynx is clear and moist.  Neck: Normal range of motion. Neck supple.  Cardiovascular: Normal rate, regular rhythm, normal heart sounds and intact distal pulses.   No murmur heard. Pulmonary/Chest: Effort normal and breath sounds normal. No respiratory distress.  Musculoskeletal: He exhibits no  edema and no tenderness.  Lymphadenopathy:    He has no cervical adenopathy.  Neurological: He is alert and oriented to person, place, and time. He exhibits normal muscle tone. Coordination normal.  Skin: Skin is warm. Rash noted. There is erythema.  Erythematous, wet, slightly vesicular lesion to the right antecubital fossa. No edema. Pustules. Radial pulse is brisk. Distal sensation intact. Patient has full range of motion of the arm    ED Course  Procedures (including critical care time) Labs Review Labs Reviewed - No data to display Imaging  Review No results found.  EKG Interpretation   None       MDM   1. Plant dermatitis    Symptoms likely related to plant dermatitis. I will treat with prednisone taper. Patient agrees to over-the-counter Benadryl as needed for itching. He appears stable for discharge.    Kinte Trim L. Severo Beber, PA-C 07/27/13 0111

## 2013-09-07 ENCOUNTER — Emergency Department (HOSPITAL_COMMUNITY)
Admission: EM | Admit: 2013-09-07 | Discharge: 2013-09-07 | Disposition: A | Discharge status: Home / Self Care | Payer: No Typology Code available for payment source | Attending: Emergency Medicine | Admitting: Emergency Medicine

## 2013-09-07 ENCOUNTER — Encounter (HOSPITAL_COMMUNITY): Payer: Self-pay | Admitting: Emergency Medicine

## 2013-09-07 DIAGNOSIS — S46911A Strain of unspecified muscle, fascia and tendon at shoulder and upper arm level, right arm, initial encounter: Secondary | ICD-10-CM

## 2013-09-07 DIAGNOSIS — X58XXXA Exposure to other specified factors, initial encounter: Secondary | ICD-10-CM | POA: Insufficient documentation

## 2013-09-07 DIAGNOSIS — Z880 Allergy status to penicillin: Secondary | ICD-10-CM | POA: Insufficient documentation

## 2013-09-07 DIAGNOSIS — M549 Dorsalgia, unspecified: Secondary | ICD-10-CM | POA: Insufficient documentation

## 2013-09-07 DIAGNOSIS — Y939 Activity, unspecified: Secondary | ICD-10-CM | POA: Insufficient documentation

## 2013-09-07 DIAGNOSIS — Y929 Unspecified place or not applicable: Secondary | ICD-10-CM | POA: Insufficient documentation

## 2013-09-07 DIAGNOSIS — IMO0002 Reserved for concepts with insufficient information to code with codable children: Secondary | ICD-10-CM | POA: Insufficient documentation

## 2013-09-07 DIAGNOSIS — G8929 Other chronic pain: Secondary | ICD-10-CM | POA: Insufficient documentation

## 2013-09-07 MED ORDER — TRAMADOL HCL 50 MG PO TABS: ORAL_TABLET | ORAL | Status: DC

## 2013-09-07 MED ORDER — IBUPROFEN 800 MG PO TABS: 800.0000 mg | ORAL_TABLET | Freq: Three times a day (TID) | ORAL | Status: DC

## 2013-09-07 MED ORDER — METHOCARBAMOL 500 MG PO TABS: 500.0000 mg | ORAL_TABLET | Freq: Three times a day (TID) | ORAL | Status: DC

## 2013-09-07 NOTE — ED Provider Notes (Signed)
CSN: [id number]     Arrival date & time 09/07/13  1338 History   First MD Initiated Contact with Patient 09/07/13 1531     Chief Complaint  Patient presents with  . Shoulder Pain   (Consider location/radiation/quality/duration/timing/severity/associated sxs/prior Treatment) Patient is a 32 y.o. male presenting with shoulder pain. The history is provided by the patient.  Shoulder Pain This is a new problem. The current episode started in the past 7 days. The problem occurs constantly. The problem has been gradually worsening. Associated symptoms include arthralgias. Pertinent negatives include no numbness or weakness. Exacerbated by: lifting and movement. He has tried nothing for the symptoms. The treatment provided no relief.    Past Medical History  Diagnosis Date  . Back pain, chronic    Past Surgical History  Procedure Laterality Date  . Back surgery     History reviewed. No pertinent family history. History  Substance Use Topics  . Smoking status: Never Smoker   . Smokeless tobacco: Not on file  . Alcohol Use: Yes     Comment: occasionally    Review of Systems  Musculoskeletal: Positive for arthralgias.  Neurological: Negative for weakness and numbness.    Allergies  Bee venom; Honey; and Penicillins  Home Medications   Current Outpatient Rx  Name  Route  Sig  Dispense  Refill  . ibuprofen (ADVIL,MOTRIN) 200 MG tablet   Oral   Take 800 mg by mouth every 6 (six) hours as needed.          BP 111/68  Pulse 103  Temp(Src) 98.1 F (36.7 C) (Oral)  Resp 18  Ht 5\' 10"  (1.778 m)  Wt 250 lb (113.399 kg)  BMI 35.87 kg/m2  SpO2 99% Physical Exam  Nursing note and vitals reviewed. Constitutional: He is oriented to person, place, and time. He appears well-developed and well-nourished.  Non-toxic appearance.  HENT:  Head: Normocephalic.  Right Ear: Tympanic membrane and external ear normal.  Left Ear: Tympanic membrane and external ear normal.  Eyes: EOM and  lids are normal. Pupils are equal, round, and reactive to light.  Neck: Normal range of motion. Neck supple. Carotid bruit is not present.  Cardiovascular: Normal rate, regular rhythm, normal heart sounds, intact distal pulses and normal pulses.   Pulmonary/Chest: Breath sounds normal. No respiratory distress.  Abdominal: Soft. Bowel sounds are normal. There is no tenderness. There is no guarding.  Musculoskeletal: Normal range of motion.       Arms: There is pain with attempted range of motion of the right shoulder. There is no evidence for dislocation. There is pain from the upper trapezius extending down to just below the scapula. No hot areas appreciated. There is full range of motion of the right elbow, and wrist, and fingers. There is no atrophy involving the thenar eminences. The radial pulses are 2+ bilaterally.  Lymphadenopathy:       Head (right side): No submandibular adenopathy present.       Head (left side): No submandibular adenopathy present.    He has no cervical adenopathy.  Neurological: He is alert and oriented to person, place, and time. He has normal strength. No cranial nerve deficit or sensory deficit.  Skin: Skin is warm and dry.  Psychiatric: He has a normal mood and affect. His speech is normal.    ED Course  Procedures (including critical care time) Labs Review Labs Reviewed - No data to display Imaging Review No results found.  EKG Interpretation   None  MDM  No diagnosis found. *I have reviewed nursing notes, vital signs, and all appropriate lab and imaging results for this patient.**  Patient presents to the emergency department with right shoulder pain that has been getting progressively worse over the last week. The patient states that over last few months he has noted some pain from time to time, but this usually goes away by itself or with ibuprofen. The pain is not responding at this point. The patient is right-handed.  Suspect the patient  has a trapezius strain. Cannot rule out a rotator cuff type injury. No evidence for septic joint.  Patient will be treated with ibuprofen, baclofen, and Ultram. Patient to see the orthopedic specialist if not improving.  Kathie Dike, PA-C 09/07/13 [phone]

## 2013-09-07 NOTE — ED Provider Notes (Signed)
Medical screening examination/treatment/procedure(s) were performed by non-physician practitioner and as supervising physician I was immediately available for consultation/collaboration.  EKG Interpretation   None         Benny Lennert, MD 09/07/13 2015

## 2013-09-07 NOTE — ED Notes (Signed)
Pt c/o right shoulder pain for a week, denies any injury to shoulder

## 2013-10-08 ENCOUNTER — Emergency Department (HOSPITAL_COMMUNITY)
Admission: EM | Admit: 2013-10-08 | Discharge: 2013-10-08 | Disposition: A | Discharge status: Home / Self Care | Payer: No Typology Code available for payment source | Attending: Emergency Medicine | Admitting: Emergency Medicine

## 2013-10-08 ENCOUNTER — Encounter (HOSPITAL_COMMUNITY): Payer: Self-pay | Admitting: Emergency Medicine

## 2013-10-08 DIAGNOSIS — H9201 Otalgia, right ear: Secondary | ICD-10-CM

## 2013-10-08 DIAGNOSIS — H9209 Otalgia, unspecified ear: Secondary | ICD-10-CM | POA: Insufficient documentation

## 2013-10-08 DIAGNOSIS — G8929 Other chronic pain: Secondary | ICD-10-CM | POA: Insufficient documentation

## 2013-10-08 DIAGNOSIS — J329 Chronic sinusitis, unspecified: Secondary | ICD-10-CM

## 2013-10-08 DIAGNOSIS — J029 Acute pharyngitis, unspecified: Secondary | ICD-10-CM

## 2013-10-08 DIAGNOSIS — Z880 Allergy status to penicillin: Secondary | ICD-10-CM | POA: Insufficient documentation

## 2013-10-08 LAB — RAPID STREP SCREEN (MED CTR MEBANE ONLY): Streptococcus, Group A Screen (Direct): NEGATIVE

## 2013-10-08 MED ORDER — FLUTICASONE PROPIONATE 50 MCG/ACT NA SUSP: 2.0000 | Freq: Every day | NASAL | Status: DC

## 2013-10-08 MED ORDER — OXYMETAZOLINE HCL 0.05 % NA SOLN: 1.0000 | Freq: Two times a day (BID) | NASAL | Status: DC

## 2013-10-08 MED ORDER — AZITHROMYCIN 250 MG PO TABS: 250.0000 mg | ORAL_TABLET | Freq: Every day | ORAL | Status: DC

## 2013-10-08 NOTE — ED Notes (Signed)
Chills, sore throat and chest congestion per pt

## 2013-10-08 NOTE — Discharge Instructions (Signed)
Call for a follow up appointment with a Family or Primary Care Provider.  If your ear pain does not improve with-in two days make a follow up appoint with an ENT specialist, Dr. Christain Sacramentoeo.  Return if Symptoms worsen.   Take medication as prescribed.

## 2013-10-08 NOTE — ED Notes (Signed)
Sore throat, rt ear hurts, Seen at Urgent care last week and told he had a cold. Taking OTC meds with out relief.

## 2013-10-08 NOTE — ED Provider Notes (Signed)
CSN: [id number]     Arrival date & time 10/08/13  1404 History   First MD Initiated Contact with Patient 10/08/13 1653     Chief Complaint  Patient presents with  . Influenza   (Consider location/radiation/quality/duration/timing/severity/associated sxs/prior Treatment) HPI Comments: Horald ChestnutJoey D Blatz is a 33 year-old male, presenting the Emergency Department with a chief complaint of headache since yesterday morning.The patient describes a right sided, retro orbital, headache.  He also complains of a sore throat since last night.  He reports right ear pain since this morning without discharge.  Reports nasal congestion and rhinorrhea. Denies fever or chills.  Denies known sick contacts. No PCP  The history is provided by the patient and medical records.    Past Medical History  Diagnosis Date  . Back pain, chronic    Past Surgical History  Procedure Laterality Date  . Back surgery     No family history on file. History  Substance Use Topics  . Smoking status: Never Smoker   . Smokeless tobacco: Not on file  . Alcohol Use: Yes     Comment: occasionally    Review of Systems  Constitutional: Negative for fever and chills.  HENT: Positive for congestion, ear pain, rhinorrhea, sinus pressure and sore throat. Negative for ear discharge and hearing loss.   Respiratory: Negative for cough.   Gastrointestinal: Negative for nausea, abdominal pain, diarrhea, constipation and abdominal distention.  Musculoskeletal: Positive for myalgias. Negative for neck pain.  Neurological: Positive for weakness and headaches.    Allergies  Bee venom; Honey; and Penicillins  Home Medications   Current Outpatient Rx  Name  Route  Sig  Dispense  Refill  . ibuprofen (ADVIL,MOTRIN) 200 MG tablet   Oral   Take 800 mg by mouth every 6 (six) hours as needed. Pain         . pseudoephedrine (SUDAFED) 60 MG tablet   Oral   Take 120 mg by mouth every 4 (four) hours as needed for congestion.          Marland Kitchen. azithromycin (ZITHROMAX Z-PAK) 250 MG tablet   Oral   Take 1 tablet (250 mg total) by mouth daily. Take 2 tablets on day 1 Take 1 tablet on days 2-5   6 tablet   0   . fluticasone (FLONASE) 50 MCG/ACT nasal spray   Each Nare   Place 2 sprays into both nostrils daily.   16 g   2   . oxymetazoline (AFRIN NASAL SPRAY) 0.05 % nasal spray   Each Nare   Place 1 spray into both nostrils 2 (two) times daily.   30 mL   0    BP 124/73  Pulse 66  Temp(Src) 97.6 F (36.4 C)  Resp 20  SpO2 100% Physical Exam  Nursing note and vitals reviewed. Constitutional: He is oriented to person, place, and time. He appears well-developed and well-nourished. No distress.  HENT:  Head: Normocephalic and atraumatic.  Right Ear: Tympanic membrane normal. There is mastoid tenderness. Tympanic membrane is not erythematous.  Left Ear: Tympanic membrane and external ear normal. No mastoid tenderness. Tympanic membrane is not erythematous.  Nose: Mucosal edema and rhinorrhea present. Right sinus exhibits maxillary sinus tenderness and frontal sinus tenderness. Left sinus exhibits maxillary sinus tenderness and frontal sinus tenderness.  Mouth/Throat: Uvula is midline and mucous membranes are normal. Mucous membranes are not pale and not dry. No trismus in the jaw. Posterior oropharyngeal erythema present. No oropharyngeal exudate or tonsillar abscesses.  Bilateral  purulent discharge in nares. Tonsil 3+ bilaterally, erythremic.    Eyes: EOM are normal. Pupils are equal, round, and reactive to light. Right eye exhibits no discharge. Left eye exhibits no discharge. No scleral icterus.  Neck: Neck supple.  Pulmonary/Chest: Effort normal. No respiratory distress.  Lymphadenopathy:       Head (right side): No submental, no submandibular, no tonsillar, no preauricular, no posterior auricular and no occipital adenopathy present.       Head (left side): No submental, no submandibular, no tonsillar, no  preauricular, no posterior auricular and no occipital adenopathy present.    He has no cervical adenopathy.  Neurological: He is oriented to person, place, and time.  Skin: Skin is warm and dry.  Psychiatric: He has a normal mood and affect. His behavior is normal.    ED Course  Procedures (including critical care time) Labs Review Labs Reviewed  RAPID STREP SCREEN  CULTURE, GROUP A STREP   Imaging Review No results found.  EKG Interpretation   None       MDM   1. Sinus infection   2. Ear pain, right   3. Sore throat    Pt with sore throat, nasal congestion, and ear pain.  Likely viral upper respiratory infection.  Tenderness to palpation of the right mastoid without overlying erythema, or lymphadenopathy will refer to ENT and prescribe Z-pak for possible infection.  Negative strep test. Discussed lab results, and treatment plan with the patient. Return precautions given. Reports understanding and no other concerns at this time.  Patient is stable for discharge at this time.  Meds given in ED:  Medications - No data to display  Discharge Medication List as of 10/08/2013  6:26 PM    START taking these medications   Details  azithromycin (ZITHROMAX Z-PAK) 250 MG tablet Take 1 tablet (250 mg total) by mouth daily. Take 2 tablets on day 1 Take 1 tablet on days 2-5, Starting 10/08/2013, Until Discontinued, Print    fluticasone (FLONASE) 50 MCG/ACT nasal spray Place 2 sprays into both nostrils daily., Starting 10/08/2013, Until Discontinued, Print    oxymetazoline (AFRIN NASAL SPRAY) 0.05 % nasal spray Place 1 spray into both nostrils 2 (two) times daily., Starting 10/08/2013, Until Discontinued, Print          Clabe Seal, PA-C 10/10/13 1414

## 2013-10-10 LAB — CULTURE, GROUP A STREP

## 2013-10-11 NOTE — ED Provider Notes (Signed)
Medical screening examination/treatment/procedure(s) were performed by non-physician practitioner and as supervising physician I was immediately available for consultation/collaboration.  EKG Interpretation   None         Michaelpaul Apo L Deondrea Aguado, MD 10/11/13 1112 

## 2014-01-03 ENCOUNTER — Emergency Department (HOSPITAL_COMMUNITY): Payer: No Typology Code available for payment source

## 2014-01-03 ENCOUNTER — Encounter (HOSPITAL_COMMUNITY): Payer: Self-pay | Admitting: Emergency Medicine

## 2014-01-03 ENCOUNTER — Emergency Department (HOSPITAL_COMMUNITY)
Admission: EM | Admit: 2014-01-03 | Discharge: 2014-01-03 | Disposition: A | Discharge status: Home / Self Care | Payer: No Typology Code available for payment source | Attending: Emergency Medicine | Admitting: Emergency Medicine

## 2014-01-03 DIAGNOSIS — IMO0002 Reserved for concepts with insufficient information to code with codable children: Secondary | ICD-10-CM

## 2014-01-03 DIAGNOSIS — Z9889 Other specified postprocedural states: Secondary | ICD-10-CM | POA: Insufficient documentation

## 2014-01-03 DIAGNOSIS — Z880 Allergy status to penicillin: Secondary | ICD-10-CM | POA: Insufficient documentation

## 2014-01-03 DIAGNOSIS — M5126 Other intervertebral disc displacement, lumbar region: Secondary | ICD-10-CM | POA: Insufficient documentation

## 2014-01-03 DIAGNOSIS — G8929 Other chronic pain: Secondary | ICD-10-CM | POA: Insufficient documentation

## 2014-01-03 MED ORDER — OXYCODONE-ACETAMINOPHEN 5-325 MG PO TABS: 1.0000 | ORAL_TABLET | ORAL | Status: DC | PRN

## 2014-01-03 MED ORDER — OXYCODONE-ACETAMINOPHEN 5-325 MG PO TABS
1.0000 | ORAL_TABLET | Freq: Once | ORAL | Status: AC
  Administered 2014-01-03: 1 via ORAL
  Filled 2014-01-03: qty 1

## 2014-01-03 MED ORDER — CYCLOBENZAPRINE HCL 10 MG PO TABS
10.0000 mg | ORAL_TABLET | Freq: Once | ORAL | Status: AC
  Administered 2014-01-03: 10 mg via ORAL
  Filled 2014-01-03: qty 1

## 2014-01-03 MED ORDER — MELOXICAM 7.5 MG PO TABS: 7.5000 mg | ORAL_TABLET | Freq: Every day | ORAL | Status: DC

## 2014-01-03 MED ORDER — GADOBENATE DIMEGLUMINE 529 MG/ML IV SOLN
20.0000 mL | Freq: Once | INTRAVENOUS | Status: AC | PRN
  Administered 2014-01-03: 20 mL via INTRAVENOUS

## 2014-01-03 MED ORDER — ONDANSETRON HCL 4 MG/2ML IJ SOLN
4.0000 mg | Freq: Once | INTRAMUSCULAR | Status: AC
  Administered 2014-01-03: 4 mg via INTRAVENOUS
  Filled 2014-01-03: qty 2

## 2014-01-03 MED ORDER — CYCLOBENZAPRINE HCL 10 MG PO TABS: 10.0000 mg | ORAL_TABLET | Freq: Two times a day (BID) | ORAL | Status: DC | PRN

## 2014-01-03 MED ORDER — KETOROLAC TROMETHAMINE 60 MG/2ML IM SOLN
60.0000 mg | Freq: Once | INTRAMUSCULAR | Status: AC
  Administered 2014-01-03: 60 mg via INTRAMUSCULAR
  Filled 2014-01-03: qty 2

## 2014-01-03 MED ORDER — HYDROMORPHONE HCL PF 1 MG/ML IJ SOLN
1.0000 mg | Freq: Once | INTRAMUSCULAR | Status: AC
  Administered 2014-01-03: 1 mg via INTRAVENOUS
  Filled 2014-01-03: qty 1

## 2014-01-03 NOTE — ED Notes (Signed)
Lower back pain radiating down left leg times one week.  Denies any injury. States he has had back surgery before .

## 2014-01-03 NOTE — ED Notes (Signed)
Pt in MRI still at this time. Mother and fiance present in room.

## 2014-01-03 NOTE — ED Notes (Signed)
Pt c/o severe back pain for one week. Pain radiating down left leg. Pt has had back surgery 2009.

## 2014-01-03 NOTE — ED Notes (Signed)
Pt d/c with NAD 

## 2014-01-03 NOTE — ED Provider Notes (Signed)
CSN: [id number]     Arrival date & time 01/03/14  [id number] History   First MD Initiated Contact with Patient 01/03/14 [id number]     Chief Complaint  Patient presents with  . Back Pain     (Consider location/radiation/quality/duration/timing/severity/associated sxs/prior Treatment) Patient is a 33 y.o. male presenting with back pain. The history is provided by the patient.  Back Pain Location:  Lumbar spine Quality:  Shooting and stabbing Radiates to:  L thigh Pain severity:  Severe Pain is:  Same all the time Onset quality:  Gradual Duration:  1 week Timing:  Constant Progression:  Worsening Relieved by:  Nothing Worsened by:  Movement, standing and ambulation Ineffective treatments:  Ibuprofen (muscle cream) Associated symptoms: leg pain   Associated symptoms: no abdominal pain, no bladder incontinence, no bowel incontinence, no chest pain, no dysuria, no fever and no headaches    Horald ChestnutJoey D Seib is a 33 y.o. male who presents to the ED with low back pain that radiates to the left leg. He states that he has had back problems every since he worked [employer] at age 33. He has flare ups on occasion. the past week he has had pain that has become severe.    Past Medical History  Diagnosis Date  . Back pain, chronic    Past Surgical History  Procedure Laterality Date  . Back surgery     History reviewed. No pertinent family history. History  Substance Use Topics  . Smoking status: Never Smoker   . Smokeless tobacco: Not on file  . Alcohol Use: Yes     Comment: occasionally    Review of Systems  Constitutional: Negative for fever and chills.  HENT: Negative.   Eyes: Negative for visual disturbance.  Respiratory: Negative for cough and shortness of breath.   Cardiovascular: Negative for chest pain.  Gastrointestinal: Negative for nausea, vomiting, abdominal pain and bowel incontinence.  Genitourinary: Negative for bladder incontinence, dysuria, urgency and frequency.    Musculoskeletal: Positive for back pain.  Skin: Negative for rash.  Neurological: Negative for headaches.  Psychiatric/Behavioral: Negative for confusion.      Allergies  Bee venom; Honey; and Penicillins  Home Medications   Prior to Admission medications   Medication Sig Start Date End Date Taking? Authorizing Provider  ibuprofen (ADVIL,MOTRIN) 200 MG tablet Take 800 mg by mouth every 6 (six) hours as needed. Pain   Yes Historical Provider, MD  pseudoephedrine (SUDAFED) 60 MG tablet Take 120 mg by mouth every 4 (four) hours as needed for congestion.   Yes Historical Provider, MD   BP 124/86  Pulse 65  Temp(Src) 97.4 F (36.3 C) (Oral)  Resp 18  Ht 5\' 11"  (1.803 m)  Wt 260 lb (117.935 kg)  BMI 36.28 kg/m2  SpO2 99% Physical Exam  Nursing note and vitals reviewed. Constitutional: He is oriented to person, place, and time. He appears well-developed and well-nourished. No distress.  HENT:  Head: Normocephalic and atraumatic.  Eyes: EOM are normal. Pupils are equal, round, and reactive to light.  Neck: Normal range of motion. Neck supple.  Cardiovascular: Normal rate and regular rhythm.   Pulmonary/Chest: Effort normal. No respiratory distress. He has no wheezes. He has no rales.  Abdominal: Soft. Bowel sounds are normal. There is no tenderness.  Musculoskeletal: Normal range of motion. He exhibits no edema.       Lumbar back: He exhibits tenderness and spasm. He exhibits normal range of motion, no deformity and normal pulse.  Back:  Neurological: He is alert and oriented to person, place, and time. A sensory deficit is present. No cranial nerve deficit. Coordination and gait normal.  Reflex Scores:      Bicep reflexes are 2+ on the right side and 2+ on the left side.      Brachioradialis reflexes are 2+ on the right side and 2+ on the left side.      Patellar reflexes are 2+ on the right side and 2+ on the left side.      Achilles reflexes are 2+ on the right side  and 2+ on the left side. Patient can not distinguish between sharp and dull on the left lowe extremity. Slightly decreased strength left lower extremity possible due to pain. Pedal pulses equal, adequate circulation, good touch sensation.   Skin: Skin is warm and dry.  Psychiatric: He has a normal mood and affect. His behavior is normal.   Mr Lumbar Spine W Wo Contrast  01/03/2014   CLINICAL DATA:  severe pain with neuro deficit left severe pain with neuro deficit left history of back surgery 2009. Left leg and foot numbness.  EXAM: MRI LUMBAR SPINE WITHOUT AND WITH CONTRAST  TECHNIQUE: Multiplanar and multiecho pulse sequences of the lumbar spine were obtained without and with intravenous contrast.  CONTRAST:  20mL MULTIHANCE GADOBENATE DIMEGLUMINE 529 MG/ML IV SOLN  COMPARISON:  12/02/2011.  11/14/2008.  FINDINGS: Five lumbar type vertebral bodies are present. This is correlated with prior plain films. Spinal alignment is normal. Paraspinal soft tissues appear normal. Vertebral body height is normal. Marrow signal shows degenerative endplate changes on the left at L4-L5 with marrow edema in the superior L5 endplate. Spinal cord terminates posterior to the L1-L2 interspace. Mild L1-L2 disc desiccation. Otherwise the lower thoracic and upper lumbar levels extending to L3-L4 are normal. There are no protrusions or stenosis.  L4-L5: Postoperative changes of left laminotomy. Resection of the left ligamentum flavum. There is mild recurrent central stenosis. The disc is desiccated and degenerated. Grade I retrolisthesis of L4 on L5 measures 3 mm. There is a broad-based disc extrusion centered in the left paracentral region. This produces mild central stenosis and bilateral lateral recess encroachment. Disc extrusion shows caudal migration. The extrusion contacts both descending L5 nerves, greater on the left than right. Both neural foramina appear adequately patent. Mild facet degeneration.  L5-S1: Disc desiccation  and loss of height. Right L5 laminotomy. Thecal sac appears adequately patent. Left lateral recess patent. Both neural foramina are patent. There is a recurrent right paracentral disc extrusion with caudal migration of disc material posteriorly displacing the right S1 nerve as it approaches the lateral recess. Perineural fibrosis is present, superimposed on the recurrent disc extrusion. The left lateral recess appears patent.  IMPRESSION: 1. Recurrent L4-L5 broad-based disc extrusion eccentric to the left with left-greater-than-right lateral recess stenosis. Previous left L4 laminotomy. Mild recurrent central stenosis. 2. Recurrent L5-S1 right paracentral disc extrusion with caudal migration of questionable significance in this patient with left-sided symptoms. The extrusion potentially affects the descending right S1 nerve in the lateral recess.   Electronically Signed   By: Andreas NewportGeoffrey  Lamke M.D.   On: 01/03/2014 12:59    ED Course  Procedures  MDM  33 y.o. male with severe low back pain and history of back surgery. Pain radiates to the left leg and he has decreased sensation in the left lower extremity. Will treat pain and he will follow up with neurosurgery. I have reviewed this patient's vital signs,  nurses notes, appropriate labs and imaging.  I have discussed findings and plan of care with the patient. He voices understanding and agrees to plan.    Medication List    TAKE these medications       cyclobenzaprine 10 MG tablet  Commonly known as:  FLEXERIL  Take 1 tablet (10 mg total) by mouth 2 (two) times daily as needed for muscle spasms.     meloxicam 7.5 MG tablet  Commonly known as:  MOBIC  Take 1 tablet (7.5 mg total) by mouth daily.     oxyCODONE-acetaminophen 5-325 MG per tablet  Commonly known as:  ROXICET  Take 1 tablet by mouth every 4 (four) hours as needed for severe pain.      ASK your doctor about these medications       ibuprofen 200 MG tablet  Commonly known as:   ADVIL,MOTRIN  Take 800 mg by mouth every 6 (six) hours as needed. Pain     pseudoephedrine 60 MG tablet  Commonly known as:  SUDAFED  Take 120 mg by mouth every 4 (four) hours as needed for congestion.           [facility] Orlene Och, [place] 01/03/14 1721

## 2014-01-03 NOTE — Care Management Note (Signed)
ED/CM noted patient did not have health insurance and/or PCP listed in the computer.  Patient was given the [place] resource handout with information on the clinics, food pantries, and the handout for new health insurance sign-up.  Patient expressed appreciation for information received. 

## 2014-01-03 NOTE — Discharge Instructions (Signed)
Back Pain, Adult Low back pain is very common. About 1 in 5 people have back pain.The cause of low back pain is rarely dangerous. The pain often gets better over time.About half of people with a sudden onset of back pain feel better in just 2 weeks. About 8 in 10 people feel better by 6 weeks.  CAUSES Some common causes of back pain include:  Strain of the muscles or ligaments supporting the spine.  Wear and tear (degeneration) of the spinal discs.  Arthritis.  Direct injury to the back. DIAGNOSIS Most of the time, the direct cause of low back pain is not known.However, back pain can be treated effectively even when the exact cause of the pain is unknown.Answering your caregiver's questions about your overall health and symptoms is one of the most accurate ways to make sure the cause of your pain is not dangerous. If your caregiver needs more information, he or she may order lab work or imaging tests (X-rays or MRIs).However, even if imaging tests show changes in your back, this usually does not require surgery. HOME CARE INSTRUCTIONS For many people, back pain returns.Since low back pain is rarely dangerous, it is often a condition that people can learn to [facility] their own.   Remain active. It is stressful on the back to sit or stand in one place. Do not sit, drive, or stand in one place for more than 30 minutes at a time. Take short walks on level surfaces as soon as pain allows.Try to increase the length of time you walk each day.  Do not stay in bed.Resting more than 1 or 2 days can delay your recovery.  Do not avoid exercise or work.Your body is made to move.It is not dangerous to be active, even though your back may hurt.Your back will likely heal faster if you return to being active before your pain is gone.  Pay attention to your body when you bend and lift. Many people have less discomfortwhen lifting if they bend their knees, keep the load close to their bodies,and  avoid twisting. Often, the most comfortable positions are those that put less stress on your recovering back.  Find a comfortable position to sleep. Use a firm mattress and lie on your side with your knees slightly bent. If you lie on your back, put a pillow under your knees.  Only take over-the-counter or prescription medicines as directed by your caregiver. Over-the-counter medicines to reduce pain and inflammation are often the most helpful.Your caregiver may prescribe muscle relaxant drugs.These medicines help dull your pain so you can more quickly return to your normal activities and healthy exercise.  Put ice on the injured area.  Put ice in a plastic bag.  Place a towel between your skin and the bag.  Leave the ice on for 15-20 minutes, 03-04 times a day for the first 2 to 3 days. After that, ice and heat may be alternated to reduce pain and spasms.  Ask your caregiver about trying back exercises and gentle massage. This may be of some benefit.  Avoid feeling anxious or stressed.Stress increases muscle tension and can worsen back pain.It is important to recognize when you are anxious or stressed and learn ways to manage it.Exercise is a great option. SEEK MEDICAL CARE IF:  You have pain that is not relieved with rest or medicine.  You have pain that does not improve in 1 week.  You have new symptoms.  You are generally not feeling well. SEEK  IMMEDIATE MEDICAL CARE IF:   You have pain that radiates from your back into your legs.  You develop new bowel or bladder control problems.  You have unusual weakness or numbness in your arms or legs.  You develop nausea or vomiting.  You develop abdominal pain.  You feel faint. Document Released: 08/30/2005 Document Revised: 02/29/2012 Document Reviewed: 01/18/2011 [facility] Patient Information 2014 [place], [place].  Herniated Disk The bones of your spinal column (vertebrae) protect your spinal cord and nerves that go into  your arms and legs. The vertebrae are separated by disks that cushion the spinal column and put space between your vertebrae. This allows movement between the vertebrae, which allows you to bend, rotate, and move your body from side to side. Sometimes, the disks move out of place (herniate) or break open (rupture) from injury or strain. The most common area for a disk herniation is in the lower back (lumbar area). Sometimes herniation occurs in the neck (cervical) disks.  CAUSES  As we grow older, the strong, fibrous cords that connect the vertebrae and support and surround the disks (ligaments) start to weaken. A strain on the back may cause a break in the disk ligaments. RISK FACTORS Herniated disks occur most often in men who are aged 18 years to 35 years, usually after strenuous activity. Other risk factors include conditions present at birth (congenital) that affect the size of the lumbar spinal canal. Additionally, a narrowing of the areas where the nerves exit the spinal canal can occur as you age. SYMPTOMS  Symptoms of a herniated disk vary. You may have weakness in certain muscles. This weakness can include difficulty lifting your leg or arm, difficulty standing on your toes on one side, or difficulty squeezing tightly with one of your hands. You may have numbness. You may feel a mild tingling, dull ache, or a burning or pulsating pain. In some cases, the pain is severe enough that you are unable to move. The pain most often occurs on one side of the body. The pain often starts slowly. It may get worse:  After you sit or stand.  At night.  When you sneeze, cough, or laugh.  When you bend backwards or walk more than a few yards. The pain, numbness, or weakness will often go away or improve a lot over a period of weeks to months. Herniated lumbar disk Symptoms of a herniated lumbar disk may include sharp pain in one part of your leg, hip, or buttocks and numbness in other parts. You also  may feel pain or numbness on the back of your calf or the top or sole of your foot. The same leg also may feel weak. Herniated cervical disk Symptoms of a herniated cervical disk may include pain when you move your neck, deep pain near or over your shoulder blade, or pain that moves to your upper arm, forearm, or fingers. DIAGNOSIS  To diagnose a herniated disk, your caregiver will perform a physical exam. Your caregiver also may perform diagnostic tests to see your disk or to test the reaction of your muscles and the function of your nerves. During the physical exam, your caregiver may ask you to:  Sit, stand, and walk. While you walk, your caregiver may ask you to try walking on your toes and then your heels.  Bend forward, backward, and sideways.  Raise your shoulders, elbow, wrist, and fingers and check your strength during these tasks. Your caregiver will check for:  Numbness or loss of  feeling.  Muscle reflexes, which may be slower or missing.  Muscle strength, which may be weaker.  Posture or the way your spine curves. Diagnostic tests that may be done include:  A spinal X-ray exam to rule out other causes of back pain.  Magnetic resonance imaging (MRI) or computed tomography (CT) scan, which will show if the herniated disk is pressing on your spinal canal.  Electromyography. This is sometimes used to identify the specific area of nerve involvement. TREATMENT  Initial treatment for a herniated disk is a short period of rest with medicines for pain. Pain medicines can include nonsteroidal anti-inflammatory medicines (NSAIDs), muscle relaxants for back spasms, and (rarely) narcotic pain medicine for severe pain that does not respond to NSAID use. Bed rest is often limited to 1 or 2 days at the most because prolonged rest can delay recovery. When the herniation involves the lower back, sitting should be avoided as much as possible because sitting increases pressure on the ruptured  disk. Sometimes a soft neck collar will be prescribed for a few days to weeks to help support your neck in the case of a cervical herniation. Physical therapy is often prescribed for patients with disk disease. Physical therapists will teach you how to properly lift, dress, walk, and perform other activities. They will work on strengthening the muscles that help support your spine. In some cases, physical therapy alone is not enough to treat a herniated disk. Steroid injections along the involved nerve root may be needed to help control pain. The steroid is injected in the area of the herniated disk and helps by reducing swelling around the disk. Sometimes surgery is the best option to treat a herniated disk.  SEEK IMMEDIATE MEDICAL CARE IF:   You have numbness, tingling, weakness, or problems with the use of your arms or legs.  You have severe headaches that are not relieved with the use of medicines.  You notice a change in your bowel or bladder control.  You have increasing pain in any areas of your body.  You experience shortness of breath, dizziness, or fainting. MAKE SURE YOU:   Understand these instructions.  Will watch your condition.  Will get help right away if you are not doing well or get worse. Document Released: 08/27/2000 Document Revised: 11/22/2011 Document Reviewed: 04/02/2011 [facility] Patient Information 2014 [place]ExitCare, [place].

## 2014-01-05 NOTE — ED Provider Notes (Signed)
Medical screening examination/treatment/procedure(s) were performed by non-physician practitioner and as supervising physician I was immediately available for consultation/collaboration.   EKG Interpretation None       Donnetta HutchingBrian Tou Hayner, MD 01/05/14 [id number]

## 2014-05-27 ENCOUNTER — Emergency Department (HOSPITAL_COMMUNITY)
Admission: EM | Admit: 2014-05-27 | Discharge: 2014-05-27 | Disposition: A | Discharge status: Home / Self Care | Payer: Self-pay | Attending: Emergency Medicine | Admitting: Emergency Medicine

## 2014-05-27 ENCOUNTER — Encounter (HOSPITAL_COMMUNITY): Payer: Self-pay | Admitting: Emergency Medicine

## 2014-05-27 DIAGNOSIS — M545 Low back pain, unspecified: Secondary | ICD-10-CM | POA: Insufficient documentation

## 2014-05-27 DIAGNOSIS — M5430 Sciatica, unspecified side: Secondary | ICD-10-CM | POA: Insufficient documentation

## 2014-05-27 DIAGNOSIS — G8929 Other chronic pain: Secondary | ICD-10-CM | POA: Insufficient documentation

## 2014-05-27 DIAGNOSIS — Z880 Allergy status to penicillin: Secondary | ICD-10-CM | POA: Insufficient documentation

## 2014-05-27 MED ORDER — DEXAMETHASONE 4 MG PO TABS: ORAL_TABLET | ORAL | Status: DC

## 2014-05-27 MED ORDER — KETOROLAC TROMETHAMINE 10 MG PO TABS
10.0000 mg | ORAL_TABLET | Freq: Once | ORAL | Status: AC
  Administered 2014-05-27: 10 mg via ORAL
  Filled 2014-05-27: qty 1

## 2014-05-27 MED ORDER — ACETAMINOPHEN-CODEINE #3 300-30 MG PO TABS: 1.0000 | ORAL_TABLET | Freq: Four times a day (QID) | ORAL | Status: DC | PRN

## 2014-05-27 MED ORDER — METHOCARBAMOL 500 MG PO TABS: 1000.0000 mg | ORAL_TABLET | Freq: Three times a day (TID) | ORAL | Status: DC

## 2014-05-27 MED ORDER — METHOCARBAMOL 500 MG PO TABS
1000.0000 mg | ORAL_TABLET | Freq: Once | ORAL | Status: AC
  Administered 2014-05-27: 1000 mg via ORAL
  Filled 2014-05-27: qty 2

## 2014-05-27 MED ORDER — PREDNISONE 50 MG PO TABS
60.0000 mg | ORAL_TABLET | Freq: Once | ORAL | Status: AC
  Administered 2014-05-27: 60 mg via ORAL
  Filled 2014-05-27 (×2): qty 1

## 2014-05-27 MED ORDER — ACETAMINOPHEN-CODEINE #3 300-30 MG PO TABS
2.0000 | ORAL_TABLET | Freq: Once | ORAL | Status: AC
Start: 2014-05-27 — End: 2014-05-27
  Administered 2014-05-27: 2 via ORAL
  Filled 2014-05-27: qty 2

## 2014-05-27 MED ORDER — DICLOFENAC SODIUM 75 MG PO TBEC: 75.0000 mg | DELAYED_RELEASE_TABLET | Freq: Two times a day (BID) | ORAL | Status: DC

## 2014-05-27 NOTE — Discharge Instructions (Signed)
It is very important that you affiliate yourself with a primary care physician to manage your pain. Please use Decadron, Robaxin, and diclofenac to assist with your back pain. May use Tylenol codeine for more severe pain. This medication as well as the Robaxin may cause drowsiness, please use with caution. Please see your back specialist as sone as possible for additional evaluation of your degenerative disc disease changes. Back Pain, Adult Low back pain is very common. About 1 in 5 people have back pain.The cause of low back pain is rarely dangerous. The pain often gets better over time.About half of people with a sudden onset of back pain feel better in just 2 weeks. About 8 in 10 people feel better by 6 weeks.  CAUSES Some common causes of back pain include:  Strain of the muscles or ligaments supporting the spine.  Wear and tear (degeneration) of the spinal discs.  Arthritis.  Direct injury to the back. DIAGNOSIS Most of the time, the direct cause of low back pain is not known.However, back pain can be treated effectively even when the exact cause of the pain is unknown.Answering your caregiver's questions about your overall health and symptoms is one of the most accurate ways to make sure the cause of your pain is not dangerous. If your caregiver needs more information, he or she may order lab work or imaging tests (X-rays or MRIs).However, even if imaging tests show changes in your back, this usually does not require surgery. HOME CARE INSTRUCTIONS For many people, back pain returns.Since low back pain is rarely dangerous, it is often a condition that people can learn to [facility] their own.   Remain active. It is stressful on the back to sit or stand in one place. Do not sit, drive, or stand in one place for more than 30 minutes at a time. Take short walks on level surfaces as soon as pain allows.Try to increase the length of time you walk each day.  Do not stay in bed.Resting  more than 1 or 2 days can delay your recovery.  Do not avoid exercise or work.Your body is made to move.It is not dangerous to be active, even though your back may hurt.Your back will likely heal faster if you return to being active before your pain is gone.  Pay attention to your body when you bend and lift. Many people have less discomfortwhen lifting if they bend their knees, keep the load close to their bodies,and avoid twisting. Often, the most comfortable positions are those that put less stress on your recovering back.  Find a comfortable position to sleep. Use a firm mattress and lie on your side with your knees slightly bent. If you lie on your back, put a pillow under your knees.  Only take over-the-counter or prescription medicines as directed by your caregiver. Over-the-counter medicines to reduce pain and inflammation are often the most helpful.Your caregiver may prescribe muscle relaxant drugs.These medicines help dull your pain so you can more quickly return to your normal activities and healthy exercise.  Put ice on the injured area.  Put ice in a plastic bag.  Place a towel between your skin and the bag.  Leave the ice on for 15-20 minutes, 03-04 times a day for the first 2 to 3 days. After that, ice and heat may be alternated to reduce pain and spasms.  Ask your caregiver about trying back exercises and gentle massage. This may be of some benefit.  Avoid feeling anxious or  stressed.Stress increases muscle tension and can worsen back pain.It is important to recognize when you are anxious or stressed and learn ways to manage it.Exercise is a great option. SEEK MEDICAL CARE IF:  You have pain that is not relieved with rest or medicine.  You have pain that does not improve in 1 week.  You have new symptoms.  You are generally not feeling well. SEEK IMMEDIATE MEDICAL CARE IF:   You have pain that radiates from your back into your legs.  You develop new bowel  or bladder control problems.  You have unusual weakness or numbness in your arms or legs.  You develop nausea or vomiting.  You develop abdominal pain.  You feel faint. Document Released: 08/30/2005 Document Revised: 02/29/2012 Document Reviewed: 01/01/2014 [facility] Patient Information 2015 [place], [place]. This information is not intended to replace advice given to you by your health care provider. Make sure you discuss any questions you have with your health care provider.

## 2014-05-27 NOTE — ED Notes (Signed)
Patient complaining of back pain radiating down right leg x 3 days. Denies injury.

## 2014-05-27 NOTE — ED Provider Notes (Signed)
CSN: [id number]     Arrival date & time 05/27/14  1008 History   First MD Initiated Contact with Patient 05/27/14 1054   This chart was scribed for non-physician practitioner Ivery Quale working with Hilario Quarry, MD by Gwenevere Abbot, ED scribe. This patient was seen in room APFT23/APFT23 and the patient's care was started at 11:55 AM.    Chief Complaint  Patient presents with  . Back Pain    Patient is a 33 y.o. male presenting with back pain. The history is provided by the patient. No language interpreter was used.  Back Pain Location:  Lumbar spine Quality:  Aching and shooting Radiates to:  R posterior upper leg and R knee Onset quality:  Gradual Duration:  3 days Associated symptoms: no fever    HPI Comments:  [name] is a 33 y.o. male who presents to the Emergency Department complaining of chronic lower back pain that radiates down the right leg and has gradually worsened, onset 3 days ago. Pt reports that he previously had a back operation in 2009 after experiencing a ruptured disc, and that this region in his backs causes him a great deal of discomfort at times. Pt reports that surgery was performed by [facility]. Pt reports that he had an MRI performed 5 months ago at [facility] ED, without any findings. Pt reports that [facility] did not view MRI results because pt does not have insurance. Pt denies recent injury, or excessive activity. Pt denies incontinence of urine or bowel. Pt does not have a PCP at this time.  Past Medical History  Diagnosis Date  . Back pain, chronic    Past Surgical History  Procedure Laterality Date  . Back surgery     History reviewed. No pertinent family history. History  Substance Use Topics  . Smoking status: Never Smoker   . Smokeless tobacco: Not on file  . Alcohol Use: Yes     Comment: occasionally    Review of Systems  Constitutional: Negative for fever and chills.  Musculoskeletal: Positive for back  pain.  All other systems reviewed and are negative.   Allergies  Bee venom; Honey; and Penicillins  Home Medications   Prior to Admission medications   Not on File   BP 120/81  Pulse 65  Temp(Src) 97.7 F (36.5 C) (Oral)  Resp 16  Ht  (1.753 m)  Wt 250 lb (113.399 kg)  BMI 36.90 kg/m2  SpO2 99% Physical Exam  Nursing note and vitals reviewed. Constitutional: He is oriented to person, place, and time. He appears well-developed and well-nourished.  HENT:  Head: Normocephalic and atraumatic.  Eyes: EOM are normal.  Neck: Normal range of motion. Neck supple.  Cardiovascular: Normal rate.   Pulmonary/Chest: Effort normal.  Musculoskeletal: Normal range of motion.       Lumbar back: He exhibits pain.  Lower lumbar pain to palpation and attempted ROM. No palpable step off. No hot areas. Pain at the lumbar periumbilical area.   Neurological: He is alert and oriented to person, place, and time. No sensory deficit.  No gross motor or sensory deficits. Pt walks with a limp, but no foot drop.   Skin: Skin is warm and dry.  Psychiatric: He has a normal mood and affect. His behavior is normal.    ED Course  I reviewed the MRI of 01/03/2014.   Procedures  DIAGNOSTIC STUDIES: Oxygen Saturation is 99% on RA, normal by my interpretation.    Labs  Review Labs Reviewed - No data to display  Imaging Review No results found.   EKG Interpretation None      MDM  No gross neurologic deficit appreciated. Patient has a history of chronic back pain. Today's evaluation suggest exacerbation of this chronic pain. The plan at this time is for the patient to see his primary physician, for orthopedic referrals. Prescription for Tylenol codeine, Decadron, will turn, and Robaxin given to the patient.    Final diagnoses:  Right low back pain, with sciatica presence unspecified    **I have reviewed nursing notes, vital signs, and all appropriate lab and imaging results for this  patient.*  **I personally performed the services described in this documentation, which was scribed in my presence. The recorded information has been reviewed and is accurate.Kathie Dike, PA-C 05/27/14 [phone]

## 2014-05-28 NOTE — ED Provider Notes (Signed)
History/physical exam/procedure(s) were performed by non-physician practitioner and as supervising physician I was immediately available for consultation/collaboration. I have reviewed all notes and am in agreement with care and plan.   Shanoah Asbill S Lilybelle Mayeda, MD 05/28/14 2321 

## 2014-12-02 ENCOUNTER — Telehealth: Payer: Self-pay | Admitting: General Practice

## 2014-12-02 NOTE — Telephone Encounter (Signed)
Patients wife aware appointment given for 4/27 with Paulene FloorMary Martin, FNP for CPE.

## 2014-12-22 ENCOUNTER — Emergency Department (HOSPITAL_COMMUNITY)
Admission: EM | Admit: 2014-12-22 | Discharge: 2014-12-22 | Disposition: A | Discharge status: Home / Self Care | Payer: BLUE CROSS/BLUE SHIELD | Attending: Emergency Medicine | Admitting: Emergency Medicine

## 2014-12-22 ENCOUNTER — Encounter (HOSPITAL_COMMUNITY): Payer: Self-pay | Admitting: Emergency Medicine

## 2014-12-22 DIAGNOSIS — M5441 Lumbago with sciatica, right side: Secondary | ICD-10-CM | POA: Diagnosis not present

## 2014-12-22 DIAGNOSIS — M5431 Sciatica, right side: Secondary | ICD-10-CM

## 2014-12-22 DIAGNOSIS — G8929 Other chronic pain: Secondary | ICD-10-CM | POA: Insufficient documentation

## 2014-12-22 DIAGNOSIS — M549 Dorsalgia, unspecified: Secondary | ICD-10-CM | POA: Diagnosis present

## 2014-12-22 DIAGNOSIS — Z880 Allergy status to penicillin: Secondary | ICD-10-CM | POA: Diagnosis not present

## 2014-12-22 MED ORDER — PREDNISONE 10 MG PO TABS: 20.0000 mg | ORAL_TABLET | Freq: Two times a day (BID) | ORAL | Status: DC

## 2014-12-22 MED ORDER — OXYCODONE-ACETAMINOPHEN 5-325 MG PO TABS
1.0000 | ORAL_TABLET | Freq: Once | ORAL | Status: AC
  Administered 2014-12-22: 1 via ORAL
  Filled 2014-12-22: qty 1

## 2014-12-22 MED ORDER — CYCLOBENZAPRINE HCL 10 MG PO TABS
10.0000 mg | ORAL_TABLET | Freq: Once | ORAL | Status: AC
  Administered 2014-12-22: 10 mg via ORAL
  Filled 2014-12-22: qty 1

## 2014-12-22 MED ORDER — HYDROCODONE-ACETAMINOPHEN 5-325 MG PO TABS: 1.0000 | ORAL_TABLET | ORAL | Status: DC | PRN

## 2014-12-22 MED ORDER — CYCLOBENZAPRINE HCL 10 MG PO TABS: 10.0000 mg | ORAL_TABLET | Freq: Two times a day (BID) | ORAL | Status: DC | PRN

## 2014-12-22 NOTE — ED Provider Notes (Signed)
CSN: [id number]     Arrival date & time 12/22/14  1612 History  This chart was scribed for non-physician practitioner Kerrie BuffaloHope Eladio Dentremont, NP, working with Gilda Creasehristopher J Pollina, MD by Littie Deedsichard Sun, ED Scribe. This patient was seen in room APFT23/APFT23 and the patient's care was started at 5:48 PM.     Chief Complaint  Patient presents with  . Back Pain   Patient is a 34 y.o. male presenting with back pain. The history is provided by the patient. No language interpreter was used.  Back Pain Location:  Lumbar spine Pain severity:  Severe Onset quality:  Gradual Duration:  10 hours Timing:  Constant Chronicity:  Chronic Ineffective treatments:  Ibuprofen Associated symptoms: no abdominal pain and no dysuria    HPI Comments: [name] is a 34 y.o. male with a hx of chronic back pain who presents to the Emergency Department complaining of gradual onset, constant, shooting, severe, worsening of his chronic back pain that started today. The pain radiates down his right leg. He also reports having pain that radiates to the buttock and down the back of his leg.  The pain is rated as a 10/10 in severity. He took some ibuprofen this morning but without relief. The pain is worsened with ambulation. Patient denies abdominal pain, urinary symptoms, and loss of bladder or bowel control. He had surgery in 2009 for ruptured disc done by Dr. Sedonia SmallAppleton (he did not have any discs removed), and has had chronic back pain since then with occasional flare-ups of more severe back pain. His last flare-up of severe back pain was this past fall, a few months ago.   Past Medical History  Diagnosis Date  . Back pain, chronic    Past Surgical History  Procedure Laterality Date  . Back surgery     History reviewed. No pertinent family history. History  Substance Use Topics  . Smoking status: Never Smoker   . Smokeless tobacco: Never Used  . Alcohol Use: Yes     Comment: occasionally    Review of Systems   Gastrointestinal: Negative for abdominal pain, diarrhea and constipation.  Genitourinary: Negative for dysuria, frequency and enuresis.  Musculoskeletal: Positive for back pain.  all other systems negative    Allergies  Bee venom; Honey; and Penicillins  Home Medications   Prior to Admission medications   Medication Sig Start Date End Date Taking? Authorizing Provider  cyclobenzaprine (FLEXERIL) 10 MG tablet Take 1 tablet (10 mg total) by mouth 2 (two) times daily as needed for muscle spasms. 12/22/14   Mozella Rexrode Orlene OchM Ezekial Arns, NP  HYDROcodone-acetaminophen (NORCO/VICODIN) 5-325 MG per tablet Take 1 tablet by mouth every 4 (four) hours as needed. 12/22/14   Zyla Dascenzo Orlene OchM Jhair Witherington, NP  predniSONE (DELTASONE) 10 MG tablet Take 2 tablets (20 mg total) by mouth 2 (two) times daily with a meal. 12/22/14   Lameisha Schuenemann Orlene OchM Tremond Shimabukuro, NP   BP 126/78 mmHg  Pulse 74  Temp(Src) 97.9 F (36.6 C) (Oral)  Resp 19  Ht 5\' 10"  (1.778 m)  Wt 280 lb (127.007 kg)  BMI 40.18 kg/m2  SpO2 98% Physical Exam  Constitutional: He is oriented to person, place, and time. He appears well-developed and well-nourished. No distress.  HENT:  Head: Normocephalic and atraumatic.  Eyes: EOM are normal.  Neck: Normal range of motion. Neck supple.  Cardiovascular: Normal rate, regular rhythm, normal heart sounds and intact distal pulses.   No murmur heard. Pulmonary/Chest: Effort normal and breath sounds normal. No respiratory distress. He  has no wheezes. He has no rales.  Abdominal: Soft. There is no tenderness.  Musculoskeletal: Normal range of motion. He exhibits tenderness. He exhibits no edema.       Lumbar back: He exhibits tenderness. He exhibits normal range of motion, no deformity, no spasm and normal pulse.  Tenderness to the right lower lumbar area and over the right sciatic nerve, radiates down the leg to the calf. Pedal pulses 2+ and equal, adequate circulation, good touch sensation. SLR without difficulty on the left , pain with SLR  right. Strength equal bilateral.  Neurological: He is alert and oriented to person, place, and time. He has normal strength. No cranial nerve deficit or sensory deficit. Coordination and gait normal.  Reflex Scores:      Bicep reflexes are 2+ on the right side and 2+ on the left side.      Brachioradialis reflexes are 2+ on the right side and 2+ on the left side.      Patellar reflexes are 2+ on the right side and 2+ on the left side.      Achilles reflexes are 2+ on the right side and 2+ on the left side. Steady gait, no foot drag.  Skin: Skin is warm and dry.  Psychiatric: He has a normal mood and affect. His behavior is normal.  Nursing note and vitals reviewed.   ED Course  Procedures  DIAGNOSTIC STUDIES: Oxygen Saturation is 100% on room air, normal by my interpretation.    COORDINATION OF CARE: 5:56 PM-Discussed treatment plan which includes steroids, muscle relaxer and pain medication with pt at bedside and pt agreed to plan. Patient is not driving.    MDM  34 y.o. male with acute exacerbation of of chronic low back pain and right sciatic pain. Stable for d/c without focal neuro deficits. No red flag for immediate neuro consult. Patient states his family member has been to the neurosurgeon in [place] and he is planning to go there. Discussed with the patient and all questioned fully answered. He will return here if any problems arise.  Will treat for pain and inflammation and muscle spasm.  Final diagnoses:  Sciatica, right   I personally performed the services described in this documentation, which was scribed in my presence. The recorded information has been reviewed and is accurate.   [address] [place], NP 12/22/14 1855  Gilda Crease, MD 12/24/14 [phone]

## 2014-12-22 NOTE — ED Notes (Addendum)
Patient c/o lower back pain that radiates down right leg. Per patient had surgery in 2009 for ruptured disc and since then has had pain in lower back. Patient states that pain become worse today. Denies taking any medication. CNS intact. Denies any problems with BMs or urination.

## 2015-01-08 ENCOUNTER — Encounter: Payer: Self-pay | Admitting: Nurse Practitioner

## 2015-01-08 ENCOUNTER — Ambulatory Visit (INDEPENDENT_AMBULATORY_CARE_PROVIDER_SITE_OTHER): Payer: BLUE CROSS/BLUE SHIELD | Admitting: Nurse Practitioner

## 2015-01-08 VITALS — BP 109/74 | HR 108 | Temp 96.9°F | Ht 70.0 in | Wt 277.0 lb

## 2015-01-08 DIAGNOSIS — M79641 Pain in right hand: Secondary | ICD-10-CM

## 2015-01-08 DIAGNOSIS — M79642 Pain in left hand: Secondary | ICD-10-CM | POA: Diagnosis not present

## 2015-01-08 DIAGNOSIS — G8929 Other chronic pain: Secondary | ICD-10-CM | POA: Diagnosis not present

## 2015-01-08 DIAGNOSIS — M549 Dorsalgia, unspecified: Secondary | ICD-10-CM

## 2015-01-08 DIAGNOSIS — J309 Allergic rhinitis, unspecified: Secondary | ICD-10-CM | POA: Diagnosis not present

## 2015-01-08 DIAGNOSIS — R0683 Snoring: Secondary | ICD-10-CM | POA: Diagnosis not present

## 2015-01-08 MED ORDER — FLUTICASONE PROPIONATE 50 MCG/ACT NA SUSP: 2.0000 | Freq: Every day | NASAL | Status: DC

## 2015-01-08 MED ORDER — CETIRIZINE HCL 10 MG PO TABS: 10.0000 mg | ORAL_TABLET | Freq: Every day | ORAL | Status: DC

## 2015-01-08 NOTE — Progress Notes (Signed)
   Subjective:    Patient ID: [name], male    DOB: 02-14-1981, 34 y.o.   MRN: [id number]  HPI Patient is in today to establish care. He has several things to discuss: - Patient had back surgery in '09 and he has continued to hurt since then. Goes to Er frequently due to pain- He is a [employer] and is still able to work- would like referral to pain clinic. painradiates down back of right leg frequently. -Cough congestion and sneezing since Sunday- No fever. - needs referral for sleep apnea test- had to cancel last scheduled appointment because he had to go to funeral - bilateral hand pain- radiates up into arms- rates pain 10/10 during night. Says all fingers hurt except index finger.    Review of Systems  Constitutional: Negative for fever, chills and fatigue.  HENT: Positive for congestion, postnasal drip and rhinorrhea. Negative for sore throat, trouble swallowing and voice change.   Respiratory: Negative for cough, shortness of breath and wheezing.   Cardiovascular: Negative.   Gastrointestinal: Negative.   Genitourinary: Negative.   Musculoskeletal: Positive for back pain and arthralgias.  Neurological: Negative.   Psychiatric/Behavioral: Negative.   All other systems reviewed and are negative.      Objective:   Physical Exam  Constitutional: He is oriented to person, place, and time. He appears well-developed and well-nourished. No distress.  HENT:  Right Ear: Hearing, tympanic membrane, external ear and ear canal normal.  Left Ear: Hearing, tympanic membrane, external ear and ear canal normal.  Nose: Mucosal edema and rhinorrhea present. Right sinus exhibits no maxillary sinus tenderness and no frontal sinus tenderness. Left sinus exhibits no maxillary sinus tenderness and no frontal sinus tenderness.  Mouth/Throat: Uvula is midline, oropharynx is clear and moist and mucous membranes are normal.  Eyes: Pupils are equal, round, and reactive to light.  Neck: Normal range of  motion.  Cardiovascular: Normal rate, regular rhythm and normal heart sounds.   Pulmonary/Chest: Effort normal and breath sounds normal.  Abdominal: Soft. Bowel sounds are normal.  Musculoskeletal:  FROM of lumbar spine witout pain today (+) SLR at 90 degress bil Motor strength and sensation distally intact (-) phalen bil hands (-) tinel bil Has pain in right thumb with any movement Grips equal bil  Lymphadenopathy:    He has no cervical adenopathy.  Neurological: He is alert and oriented to person, place, and time. He has normal reflexes.  Skin: Skin is warm and dry.  Psychiatric: He has a normal mood and affect. His behavior is normal. Judgment and thought content normal.  BP 109/74 mmHg  Pulse 108  Temp(Src) 96.9 F (36.1 C) (Oral)  Ht 5\' 10"  (1.778 m)  Wt 277 lb (125.646 kg)  BMI 39.75 kg/m2         Assessment & Plan:  1. Bilateral hand pain Motrin OTC as needed - Ambulatory referral to Orthopedic Surgery  2. Chronic back pain - Ambulatory referral to Pain Clinic  3. Snoring - Ambulatory referral to Sleep Studies  4. Allergic rhinitis, unspecified allergic rhinitis type Avoid allergens  - fluticasone (FLONASE) 50 MCG/ACT nasal spray; Place 2 sprays into both nostrils daily.  Dispense: 16 g; Refill: 6  - cetirizine (ZYRTEC) 10 MG tablet; Take 1 tablet (10 mg total) by mouth at bedtime.  Dispense: 30 tablet; Refill: 6  Follow up in 6 months  Mary-Margaret Daphine DeutscherMartin, FNP

## 2015-01-08 NOTE — Patient Instructions (Signed)

## 2015-04-09 ENCOUNTER — Institutional Professional Consult (permissible substitution): Payer: BLUE CROSS/BLUE SHIELD | Admitting: Internal Medicine

## 2015-06-25 ENCOUNTER — Encounter (HOSPITAL_COMMUNITY): Payer: Self-pay | Admitting: Emergency Medicine

## 2015-06-25 ENCOUNTER — Emergency Department (HOSPITAL_COMMUNITY)
Admission: EM | Admit: 2015-06-25 | Discharge: 2015-06-25 | Disposition: A | Discharge status: Home / Self Care | Payer: BLUE CROSS/BLUE SHIELD | Attending: Emergency Medicine | Admitting: Emergency Medicine

## 2015-06-25 DIAGNOSIS — Z7951 Long term (current) use of inhaled steroids: Secondary | ICD-10-CM | POA: Insufficient documentation

## 2015-06-25 DIAGNOSIS — J0110 Acute frontal sinusitis, unspecified: Secondary | ICD-10-CM | POA: Insufficient documentation

## 2015-06-25 DIAGNOSIS — Z880 Allergy status to penicillin: Secondary | ICD-10-CM | POA: Insufficient documentation

## 2015-06-25 DIAGNOSIS — G8929 Other chronic pain: Secondary | ICD-10-CM | POA: Insufficient documentation

## 2015-06-25 DIAGNOSIS — Z7952 Long term (current) use of systemic steroids: Secondary | ICD-10-CM | POA: Insufficient documentation

## 2015-06-25 MED ORDER — DOXYCYCLINE HYCLATE 100 MG PO CAPS: 100.0000 mg | ORAL_CAPSULE | Freq: Two times a day (BID) | ORAL | Status: DC

## 2015-06-25 NOTE — Discharge Instructions (Signed)

## 2015-06-25 NOTE — ED Provider Notes (Signed)
CSN: [id number]     Arrival date & time 06/25/15  1245 History   First MD Initiated Contact with Patient 06/25/15 1258     Chief Complaint  Patient presents with  . Facial Pain     (Consider location/radiation/quality/duration/timing/severity/associated sxs/prior Treatment) Patient is a 34 y.o. male presenting with cough. The history is provided by the patient. No language interpreter was used.  Cough Cough characteristics:  Productive Sputum characteristics:  Nondescript Severity:  Moderate Duration:  1 week Timing:  Constant Progression:  Worsening Chronicity:  New Smoker: no   Context: upper respiratory infection   Relieved by:  Nothing Worsened by:  Nothing tried Ineffective treatments:  None tried Associated symptoms: sinus congestion and sore throat   Associated symptoms: no ear pain   Pt began having pain right side of forehead and behind right eye.  Pt reports he has a history of sinus problems. Pt reports drainage and congestion.   Past Medical History  Diagnosis Date  . Back pain, chronic    Past Surgical History  Procedure Laterality Date  . Back surgery     History reviewed. No pertinent family history. Social History  Substance Use Topics  . Smoking status: Never Smoker   . Smokeless tobacco: Never Used  . Alcohol Use: Yes     Comment: occasionally    Review of Systems  HENT: Positive for congestion and sore throat. Negative for ear pain.   Respiratory: Positive for cough.   All other systems reviewed and are negative.     Allergies  Bee venom; Honey; and Penicillins  Home Medications   Prior to Admission medications   Medication Sig Start Date End Date Taking? Authorizing Provider  cetirizine (ZYRTEC) 10 MG tablet Take 1 tablet (10 mg total) by mouth at bedtime. 01/08/15   Mary-Margaret Daphine Deutscher, FNP  cyclobenzaprine (FLEXERIL) 10 MG tablet Take 1 tablet (10 mg total) by mouth 2 (two) times daily as needed for muscle spasms. 12/22/14   Hope Orlene Och, NP  fluticasone (FLONASE) 50 MCG/ACT nasal spray Place 2 sprays into both nostrils daily. 01/08/15   Mary-Margaret Daphine Deutscher, FNP  HYDROcodone-acetaminophen (NORCO/VICODIN) 5-325 MG per tablet Take 1 tablet by mouth every 4 (four) hours as needed. 12/22/14   Hope Orlene Och, NP  predniSONE (DELTASONE) 10 MG tablet Take 2 tablets (20 mg total) by mouth 2 (two) times daily with a meal. 12/22/14   Hope Orlene Och, NP   BP 130/85 mmHg  Pulse 86  Temp(Src) 97.7 F (36.5 C) (Oral)  Resp 13  Ht  (1.803 m)  Wt 269 lb (122.018 kg)  BMI 37.53 kg/m2  SpO2 99% Physical Exam  Constitutional: He appears well-developed and well-nourished.  HENT:  Head: Normocephalic and atraumatic.  Tender right frontal sinus,  Tender right maxillary sinus  Eyes: Conjunctivae are normal. Pupils are equal, round, and reactive to light.  Neck: Normal range of motion. Neck supple.  Cardiovascular: Normal rate and normal heart sounds.   Pulmonary/Chest: Effort normal.  Neurological: He is alert.  Skin: Skin is warm.  Psychiatric: He has a normal mood and affect.  Nursing note and vitals reviewed.   ED Course  Procedures (including critical care time) Labs Review Labs Reviewed - No data to display  Imaging Review No results found. I have personally reviewed and evaluated these images and lab results as part of my medical decision-making.   EKG Interpretation None      MDM   Final diagnoses:  None  Pt advised ibuprofen otc decongetants Rx for doxycycline avs    Elson AreasLeslie K Januel Doolan, PA-C 06/25/15 1336  Bethann BerkshireJoseph Zammit, MD 06/26/15 1536

## 2015-06-25 NOTE — ED Notes (Signed)
Pt reports cough, sneezing, and sinus pressure since last night.  Pt alert and oriented no further complaints.

## 2015-09-09 ENCOUNTER — Emergency Department (HOSPITAL_COMMUNITY)
Admission: EM | Admit: 2015-09-09 | Discharge: 2015-09-09 | Disposition: A | Discharge status: Home / Self Care | Payer: BLUE CROSS/BLUE SHIELD | Attending: Emergency Medicine | Admitting: Emergency Medicine

## 2015-09-09 DIAGNOSIS — Z880 Allergy status to penicillin: Secondary | ICD-10-CM | POA: Insufficient documentation

## 2015-09-09 DIAGNOSIS — Z7952 Long term (current) use of systemic steroids: Secondary | ICD-10-CM | POA: Insufficient documentation

## 2015-09-09 DIAGNOSIS — J029 Acute pharyngitis, unspecified: Secondary | ICD-10-CM | POA: Insufficient documentation

## 2015-09-09 DIAGNOSIS — Z7951 Long term (current) use of inhaled steroids: Secondary | ICD-10-CM | POA: Insufficient documentation

## 2015-09-09 DIAGNOSIS — G8929 Other chronic pain: Secondary | ICD-10-CM | POA: Insufficient documentation

## 2015-09-09 DIAGNOSIS — Z792 Long term (current) use of antibiotics: Secondary | ICD-10-CM | POA: Insufficient documentation

## 2015-09-09 LAB — RAPID STREP SCREEN (MED CTR MEBANE ONLY): Streptococcus, Group A Screen (Direct): NEGATIVE

## 2015-09-09 MED ORDER — IBUPROFEN 800 MG PO TABS
800.0000 mg | ORAL_TABLET | Freq: Once | ORAL | Status: AC
  Administered 2015-09-09: 800 mg via ORAL
  Filled 2015-09-09: qty 2

## 2015-09-09 MED ORDER — IBUPROFEN 800 MG PO TABS: 800.0000 mg | ORAL_TABLET | Freq: Three times a day (TID) | ORAL | Status: DC

## 2015-09-09 NOTE — Discharge Instructions (Signed)

## 2015-09-09 NOTE — ED Provider Notes (Signed)
CSN: [id number]     Arrival date & time 09/09/15  0757 History  By signing my name below, I, Marica Otterusrat Rahman, attest that this documentation has been prepared under the direction and in the presence of No att. providers found. Electronically Signed: Marica OtterNusrat Rahman, ED Scribe. 09/09/2015. 8:20 AM.  Chief Complaint  Patient presents with  . Sore Throat   The history is provided by the patient. No language interpreter was used.   PCP: Bennie PieriniMARTIN,MARY MARGARET, FNP HPI Comments: [name] is a 34 y.o. male, with PMHx noted below, who presents to the Emergency Department complaining of sore throat radiating to bilateral ears onset yesterday. Associated Sx include unproductive cough, pain with swallowing, muscle aches between the shoulder blades, decreased appetite, and chills. Pt reports sick contacts noting his daughter was recently Dx with bronchitis. Pt denies receiving a flu shot this season. Pt denies taking any meds for his Sx PTA to the ED.Pt denies headache, facial pain, dental pain, fever, or vomiting. Pt denies any chronic health conditions.    Past Medical History  Diagnosis Date  . Back pain, chronic    Past Surgical History  Procedure Laterality Date  . Back surgery     No family history on file. Social History  Substance Use Topics  . Smoking status: Never Smoker   . Smokeless tobacco: Never Used  . Alcohol Use: Yes     Comment: occasionally    Review of Systems  A complete 10 system review of systems was obtained and all systems are negative except as noted in the HPI and PMH.   Allergies  Bee venom; Honey; and Penicillins  Home Medications   Prior to Admission medications   Medication Sig Start Date End Date Taking? Authorizing Provider  cetirizine (ZYRTEC) 10 MG tablet Take 1 tablet (10 mg total) by mouth at bedtime. 01/08/15   Mary-Margaret Daphine DeutscherMartin, FNP  cyclobenzaprine (FLEXERIL) 10 MG tablet Take 1 tablet (10 mg total) by mouth 2 (two) times daily as needed for  muscle spasms. 12/22/14   Hope Orlene OchM Neese, NP  doxycycline (VIBRAMYCIN) 100 MG capsule Take 1 capsule (100 mg total) by mouth 2 (two) times daily. 06/25/15   Elson AreasLeslie K Sofia, PA-C  fluticasone [facility](FLONASE) 50 MCG/ACT nasal spray Place 2 sprays into both nostrils daily. 01/08/15   Mary-Margaret Daphine DeutscherMartin, FNP  HYDROcodone-acetaminophen (NORCO/VICODIN) 5-325 MG per tablet Take 1 tablet by mouth every 4 (four) hours as needed. 12/22/14   Hope Orlene OchM Neese, NP  ibuprofen (ADVIL,MOTRIN) 800 MG tablet Take 1 tablet (800 mg total) by mouth 3 (three) times daily. 09/09/15   Glynn OctaveStephen Alonso Gapinski, MD  predniSONE (DELTASONE) 10 MG tablet Take 2 tablets (20 mg total) by mouth 2 (two) times daily with a meal. 12/22/14   Hope Orlene OchM Neese, NP   Triage Vitals: BP 129/90 mmHg  Pulse 62  Temp(Src) 97.7 F (36.5 C) (Oral)  Resp 20  Ht 5\' 10"  (1.778 m)  Wt 260 lb (117.935 kg)  BMI 37.31 kg/m2  SpO2 100% Physical Exam  Constitutional: He is oriented to person, place, and time. He appears well-developed and well-nourished. No distress.  HENT:  Head: Normocephalic and atraumatic.  Mouth/Throat: Posterior oropharyngeal erythema present. No oropharyngeal exudate.  Right TM: serous fluid behind right TM. No sinus tenderness.   Eyes: Conjunctivae and EOM are normal. Pupils are equal, round, and reactive to light.  Neck: Normal range of motion. Neck supple.  No meningismus.  Cardiovascular: Normal rate, regular rhythm, normal heart sounds and intact  distal pulses.   No murmur heard. Pulmonary/Chest: Effort normal and breath sounds normal. No respiratory distress.  Abdominal: Soft. There is no tenderness. There is no rebound and no guarding.  Musculoskeletal: Normal range of motion. He exhibits no edema or tenderness.  Neurological: He is alert and oriented to person, place, and time. No cranial nerve deficit. He exhibits normal muscle tone. Coordination normal.  No ataxia on finger to nose bilaterally. No pronator drift. 5/5 strength  throughout. CN 2-12 intact.Equal grip strength. Sensation intact.   Skin: Skin is warm.  Psychiatric: He has a normal mood and affect. His behavior is normal.  Nursing note and vitals reviewed.   ED Course  Procedures (including critical care time) DIAGNOSTIC STUDIES: Oxygen Saturation is 100% on ra, nl by my interpretation.    COORDINATION OF CARE: 8:10 AM: Discussed treatment plan which includes meds and labs  with pt at bedside; patient verbalizes understanding and agrees with treatment plan.  Labs Review Labs Reviewed  RAPID STREP SCREEN (NOT AT [facility])  CULTURE, GROUP A STREP    Imaging Review No results found. I have personally reviewed and evaluated these lab results as part of my medical decision-making.   MDM   Final diagnoses:  Pharyngitis   Sore throat, nonproductive cough, right ear pain since yesterday after being exposed to his sick child. No fever. No chest pain or shortness of breath.  Rapid strep is negative. Tympanic membranes are benign.   Supportive care for viral pharyngitis. Tylenol and Motrin as needed for fever and aches. Follow-up with PCP. Return precautions discussed.   I personally performed the services described in this documentation, which was scribed in my presence. The recorded information has been reviewed and is accurate.   Glynn Octave, MD 09/09/15 [phone]

## 2015-09-09 NOTE — ED Notes (Signed)
Pt c/o soret hroat, non productive cough, right ear pain since yesterday.

## 2015-09-11 ENCOUNTER — Emergency Department (HOSPITAL_COMMUNITY): Payer: Self-pay

## 2015-09-11 ENCOUNTER — Emergency Department (HOSPITAL_COMMUNITY)
Admission: EM | Admit: 2015-09-11 | Discharge: 2015-09-11 | Disposition: A | Discharge status: Home / Self Care | Payer: Self-pay | Attending: Emergency Medicine | Admitting: Emergency Medicine

## 2015-09-11 ENCOUNTER — Encounter (HOSPITAL_COMMUNITY): Payer: Self-pay | Admitting: *Deleted

## 2015-09-11 DIAGNOSIS — G8929 Other chronic pain: Secondary | ICD-10-CM | POA: Insufficient documentation

## 2015-09-11 DIAGNOSIS — Z880 Allergy status to penicillin: Secondary | ICD-10-CM | POA: Insufficient documentation

## 2015-09-11 DIAGNOSIS — Z7952 Long term (current) use of systemic steroids: Secondary | ICD-10-CM | POA: Insufficient documentation

## 2015-09-11 DIAGNOSIS — B349 Viral infection, unspecified: Secondary | ICD-10-CM | POA: Insufficient documentation

## 2015-09-11 DIAGNOSIS — Z791 Long term (current) use of non-steroidal anti-inflammatories (NSAID): Secondary | ICD-10-CM | POA: Insufficient documentation

## 2015-09-11 DIAGNOSIS — Z792 Long term (current) use of antibiotics: Secondary | ICD-10-CM | POA: Insufficient documentation

## 2015-09-11 DIAGNOSIS — Z7951 Long term (current) use of inhaled steroids: Secondary | ICD-10-CM | POA: Insufficient documentation

## 2015-09-11 LAB — BASIC METABOLIC PANEL
ANION GAP: 7 (ref 5–15)
BUN: 11 mg/dL (ref 6–20)
CHLORIDE: 100 mmol/L — AB (ref 101–111)
CO2: 31 mmol/L (ref 22–32)
Calcium: 9.3 mg/dL (ref 8.9–10.3)
Creatinine, Ser: 0.9 mg/dL (ref 0.61–1.24)
GFR calc non Af Amer: >60 mL/min (ref >60)
Glucose, Bld: 95 mg/dL (ref 65–99)
Potassium: 3.9 mmol/L (ref 3.5–5.1)
Sodium: 138 mmol/L (ref 135–145)

## 2015-09-11 LAB — CBC WITH DIFFERENTIAL/PLATELET
BASOS ABS: 0 10*3/uL (ref 0.0–0.1)
BASOS PCT: 1 %
EOS ABS: 0.1 10*3/uL (ref 0.0–0.7)
EOS PCT: 2 %
HCT: 40.4 % (ref 39.0–52.0)
Hemoglobin: 13.3 g/dL (ref 13.0–17.0)
LYMPHS ABS: 2.1 10*3/uL (ref 0.7–4.0)
Lymphocytes Relative: 39 %
MCH: 27.4 pg (ref 26.0–34.0)
MCHC: 32.9 g/dL (ref 30.0–36.0)
MCV: 83.1 fL (ref 78.0–100.0)
Monocytes Absolute: 0.7 10*3/uL (ref 0.1–1.0)
Monocytes Relative: 12 %
NEUTROS PCT: 46 %
Neutro Abs: 2.5 10*3/uL (ref 1.7–7.7)
PLATELETS: 212 10*3/uL (ref 150–400)
RBC: 4.86 MIL/uL (ref 4.22–5.81)
RDW: 12.7 % (ref 11.5–15.5)
WBC: 5.3 10*3/uL (ref 4.0–10.5)

## 2015-09-11 LAB — CULTURE, GROUP A STREP

## 2015-09-11 MED ORDER — OSELTAMIVIR PHOSPHATE 75 MG PO CAPS: 75.0000 mg | ORAL_CAPSULE | Freq: Two times a day (BID) | ORAL | Status: DC

## 2015-09-11 NOTE — ED Notes (Signed)
Langston MaskerKaren Sofia gave verbal order for return to work next week.

## 2015-09-11 NOTE — ED Notes (Signed)
.   Pt presents with body aches, sore throat, and bilateral ear pain. NAD noted. Pt denies fevers, n/v/d. Pt states he was recently seen for same and feels no better.

## 2015-09-11 NOTE — Discharge Instructions (Signed)

## 2015-09-11 NOTE — ED Provider Notes (Signed)
CSN: [id number]     Arrival date & time 09/11/15  4098 History   First MD Initiated Contact with Patient 09/11/15 [phone]     Chief Complaint  Patient presents with  . Generalized Body Aches     (Consider location/radiation/quality/duration/timing/severity/associated sxs/prior Treatment) Patient is a 34 y.o. male presenting with cough. The history is provided by the patient. No language interpreter was used.  Cough Cough characteristics:  Non-productive Severity:  Moderate Onset quality:  Gradual Duration:  2 days Timing:  Constant Progression:  Worsening Chronicity:  New Smoker: no   Context: sick contacts and upper respiratory infection   Relieved by:  Nothing Worsened by:  Nothing tried Associated symptoms: no rhinorrhea and no wheezing     Past Medical History  Diagnosis Date  . Back pain, chronic    Past Surgical History  Procedure Laterality Date  . Back surgery     No family history on file. Social History  Substance Use Topics  . Smoking status: Never Smoker   . Smokeless tobacco: Never Used  . Alcohol Use: Yes     Comment: occasionally    Review of Systems  HENT: Negative for rhinorrhea.   Respiratory: Positive for cough. Negative for wheezing.   All other systems reviewed and are negative.     Allergies  Bee venom; Honey; and Penicillins  Home Medications   Prior to Admission medications   Medication Sig Start Date End Date Taking? Authorizing Provider  cetirizine (ZYRTEC) 10 MG tablet Take 1 tablet (10 mg total) by mouth at bedtime. 01/08/15   Mary-Margaret Daphine Deutscher, FNP  cyclobenzaprine (FLEXERIL) 10 MG tablet Take 1 tablet (10 mg total) by mouth 2 (two) times daily as needed for muscle spasms. 12/22/14   Hope Orlene Och, NP  doxycycline (VIBRAMYCIN) 100 MG capsule Take 1 capsule (100 mg total) by mouth 2 (two) times daily. 06/25/15   Elson Areas, PA-C  fluticasone [facility]) 50 MCG/ACT nasal spray Place 2 sprays into both nostrils daily. 01/08/15    Mary-Margaret Daphine Deutscher, FNP  HYDROcodone-acetaminophen (NORCO/VICODIN) 5-325 MG per tablet Take 1 tablet by mouth every 4 (four) hours as needed. 12/22/14   Hope Orlene Och, NP  ibuprofen (ADVIL,MOTRIN) 800 MG tablet Take 1 tablet (800 mg total) by mouth 3 (three) times daily. 09/09/15   Glynn Octave, MD  predniSONE (DELTASONE) 10 MG tablet Take 2 tablets (20 mg total) by mouth 2 (two) times daily with a meal. 12/22/14   Hope Orlene Och, NP   BP 121/89 mmHg  Pulse 64  Temp(Src) 98.1 F (36.7 C) (Oral)  Resp 18  Ht  (1.778 m)  Wt 117.935 kg  BMI 37.31 kg/m2  SpO2 100% Physical Exam  Constitutional: He is oriented to person, place, and time. He appears well-developed and well-nourished.  HENT:  Head: Normocephalic and atraumatic.  Right Ear: External ear normal.  Left Ear: External ear normal.  Mouth/Throat: Oropharynx is clear and moist.  Eyes: EOM are normal. Pupils are equal, round, and reactive to light.  Neck: Normal range of motion.  Cardiovascular: Normal rate and normal heart sounds.   Pulmonary/Chest: Effort normal.  Abdominal: Soft. He exhibits no distension.  Musculoskeletal: Normal range of motion.  Neurological: He is alert and oriented to person, place, and time.  Skin: Skin is warm.  Psychiatric: He has a normal mood and affect.  Nursing note and vitals reviewed.   ED Course  Procedures (including critical care time) Labs Review Labs Reviewed  BASIC METABOLIC PANEL -  Abnormal; Notable for the following:    Chloride 100 (*)    All other components within normal limits  CBC WITH DIFFERENTIAL/PLATELET    Imaging Review Dg Chest 2 View  09/11/2015  CLINICAL DATA:  Cough and fever for several days EXAM: CHEST - 2 VIEW COMPARISON:  None. FINDINGS: The heart size and mediastinal contours are within normal limits. Both lungs are clear. The visualized skeletal structures are unremarkable. IMPRESSION: No active disease. Electronically Signed   By: Alcide CleverMark  Lukens M.D.    On: 09/11/2015 08:46   I have personally reviewed and evaluated these images and lab results as part of my medical decision-making.   EKG Interpretation None      MDM   Final diagnoses:  Viral illness   Possible influenza,   I will treat with tamiflu.  Pt given discount card.  Pt advised to follow up with his primary    Elson AreasLeslie K Sofia, [place] JerseyPA-C 09/11/15 [phone]  Marily MemosJason Mesner, MD 09/11/15 1019

## 2016-08-31 ENCOUNTER — Encounter (HOSPITAL_COMMUNITY): Payer: Self-pay

## 2016-08-31 ENCOUNTER — Emergency Department (HOSPITAL_COMMUNITY)
Admission: EM | Admit: 2016-08-31 | Discharge: 2016-08-31 | Disposition: A | Discharge status: Home / Self Care | Payer: Self-pay | Attending: Emergency Medicine | Admitting: Emergency Medicine

## 2016-08-31 ENCOUNTER — Emergency Department (HOSPITAL_COMMUNITY): Payer: Self-pay

## 2016-08-31 DIAGNOSIS — R69 Illness, unspecified: Secondary | ICD-10-CM

## 2016-08-31 DIAGNOSIS — J069 Acute upper respiratory infection, unspecified: Secondary | ICD-10-CM

## 2016-08-31 DIAGNOSIS — J111 Influenza due to unidentified influenza virus with other respiratory manifestations: Secondary | ICD-10-CM | POA: Insufficient documentation

## 2016-08-31 MED ORDER — GUAIFENESIN ER 1200 MG PO TB12: 1.0000 | ORAL_TABLET | Freq: Two times a day (BID) | ORAL | 0 refills | Status: DC

## 2016-08-31 MED ORDER — IBUPROFEN 800 MG PO TABS
800.0000 mg | ORAL_TABLET | Freq: Three times a day (TID) | ORAL | 0 refills | Status: DC | PRN
Start: 2016-08-31 — End: 2018-01-03

## 2016-08-31 MED ORDER — PROMETHAZINE-DM 6.25-15 MG/5ML PO SYRP: 5.0000 mL | ORAL_SOLUTION | Freq: Four times a day (QID) | ORAL | 0 refills | Status: DC | PRN

## 2016-08-31 NOTE — Discharge Instructions (Signed)
Return here as needed. rest as much possible.  Increase your fluid intake  Follow-up with a primary care doctor. Your x-ray is negative.

## 2016-08-31 NOTE — ED Triage Notes (Signed)
Pt reports fever, cough, and aching all over since yesterday.  Pt reports fever 101 this morning and he took alkaseltzer.  Pt had ibuprofen last night.

## 2016-08-31 NOTE — ED Provider Notes (Signed)
[facility]-EMERGENCY DEPT Provider Note   CSN: [id number] Arrival date & time: 08/31/16  1248     History   Chief Complaint Chief Complaint  Patient presents with  . Cough  . Fever    HPI [name] is a 35 y.o. male.  HPI Patient presents to the emergency department with flulike symptoms.  Patient states that they started 2 days ago with body aches, cough, sore throat and nasal congestion.  Patient states that several people at work and been sick as well.  He states that he did not take any medications prior to arrival.  Nothing seems make his condition, better or worseThe patient denies chest pain, shortness of breath, headache,blurred vision, neck pain,  weakness, numbness, dizziness, anorexia, edema, abdominal pain, nausea, vomiting, diarrhea, rash, back pain, dysuria, hematemesis, bloody stool, near syncope, or syncope. Past Medical History:  Diagnosis Date  . Back pain, chronic     There are no active problems to display for this patient.   Past Surgical History:  Procedure Laterality Date  . BACK SURGERY         Home Medications    Prior to Admission medications   Medication Sig Start Date End Date Taking? Authorizing Provider  Guaifenesin 1200 MG TB12 Take 1 tablet (1,200 mg total) by mouth 2 (two) times daily. 08/31/16   Charlestine Nighthristopher Evaristo Tsuda, PA-C  ibuprofen (ADVIL,MOTRIN) 800 MG tablet Take 1 tablet (800 mg total) by mouth every 8 (eight) hours as needed. 08/31/16   Charlestine Nighthristopher Thadius Smisek, PA-C  promethazine-dextromethorphan (PROMETHAZINE-DM) 6.25-15 MG/5ML syrup Take 5 mLs by mouth 4 (four) times daily as needed for cough. 08/31/16   Charlestine Nighthristopher Rhyatt Muska, PA-C    Family History No family history on file.  Social History Social History  Substance Use Topics  . Smoking status: Never Smoker  . Smokeless tobacco: Never Used  . Alcohol use Yes     Comment: occasionally     Allergies   Bee venom; Honey; and Penicillins   Review of Systems Review of  Systems All other systems negative except as documented in the HPI. All pertinent positives and negatives as reviewed in the HPI.  Physical Exam Updated Vital Signs BP 120/80   Pulse 69   Temp 97.8 F (36.6 C) (Oral)   Resp 16   Ht 5\' 10"  (1.778 m)   Wt 120.2 kg   SpO2 97%   BMI 38.02 kg/m   Physical Exam  Constitutional: He is oriented to person, place, and time. He appears well-developed and well-nourished. No distress.  HENT:  Head: Normocephalic and atraumatic.  Mouth/Throat: Oropharynx is clear and moist.  Eyes: Pupils are equal, round, and reactive to light.  Neck: Normal range of motion. Neck supple.  Cardiovascular: Normal rate, regular rhythm and normal heart sounds.  Exam reveals no gallop and no friction rub.   No murmur heard. Pulmonary/Chest: Effort normal and breath sounds normal. No respiratory distress. He has no wheezes.  Neurological: He is alert and oriented to person, place, and time. No sensory deficit.  Skin: Skin is warm and dry. No rash noted. No erythema.  Psychiatric: He has a normal mood and affect. His behavior is normal.  Nursing note and vitals reviewed.    ED Treatments / Results  Labs (all labs ordered are listed, but only abnormal results are displayed) Labs Reviewed - No data to display  EKG  EKG Interpretation None       Radiology Dg Chest 2 View  Result Date: 08/31/2016 CLINICAL DATA:  Cough, fever. EXAM: CHEST  2 VIEW COMPARISON:  Radiographs of September 11, 2015. FINDINGS: The heart size and mediastinal contours are within normal limits. Both lungs are clear. No pneumothorax or pleural effusion is noted. The visualized skeletal structures are unremarkable. IMPRESSION: No active cardiopulmonary disease. Electronically Signed   By: Lupita RaiderJames  Green Jr, M.D.   On: 08/31/2016 13:05    Procedures Procedures (including critical care time)  Medications Ordered in ED Medications - No data to display   Initial Impression / Assessment  and Plan / ED Course  I have reviewed the triage vital signs and the nursing notes.  Pertinent labs & imaging results that were available during my care of the patient were reviewed by me and considered in my medical decision making (see chart for details).  Clinical Course     Patient is given strict treatment forInfluenza-like illness.  Told to return here as needed.  Patient is advised to rest as much possible and increase his fluid intake  Final Clinical Impressions(s) / ED Diagnoses   Final diagnoses:  Influenza-like illness  Upper respiratory tract infection, unspecified type    New Prescriptions Discharge Medication List as of 08/31/2016  1:40 PM    START taking these medications   Details  Guaifenesin 1200 MG TB12 Take 1 tablet (1,200 mg total) by mouth 2 (two) times daily., Starting Tue 08/31/2016, Print    promethazine-dextromethorphan (PROMETHAZINE-DM) 6.25-15 MG/5ML syrup Take 5 mLs by mouth 4 (four) times daily as needed for cough., Starting Tue 08/31/2016, Print         Eli Lilly and CompanyChristopher Deann Mclaine, PA-C 08/31/16 1708    Blane OharaJoshua Zavitz, MD 09/04/16 [phone]

## 2016-08-31 NOTE — ED Notes (Signed)
ED Provider at bedside. 

## 2018-01-03 ENCOUNTER — Encounter: Payer: Self-pay | Admitting: Family Medicine

## 2018-01-03 ENCOUNTER — Ambulatory Visit (INDEPENDENT_AMBULATORY_CARE_PROVIDER_SITE_OTHER): Payer: BLUE CROSS/BLUE SHIELD | Admitting: Family Medicine

## 2018-01-03 VITALS — BP 114/81 | HR 78 | Temp 97.5°F | Ht 70.0 in | Wt 263.1 lb

## 2018-01-03 DIAGNOSIS — M5416 Radiculopathy, lumbar region: Secondary | ICD-10-CM | POA: Diagnosis not present

## 2018-01-03 DIAGNOSIS — F322 Major depressive disorder, single episode, severe without psychotic features: Secondary | ICD-10-CM | POA: Diagnosis not present

## 2018-01-03 DIAGNOSIS — Z0000 Encounter for general adult medical examination without abnormal findings: Secondary | ICD-10-CM

## 2018-01-03 DIAGNOSIS — J301 Allergic rhinitis due to pollen: Secondary | ICD-10-CM | POA: Diagnosis not present

## 2018-01-03 LAB — URINALYSIS
Bilirubin, UA: NEGATIVE
Glucose, UA: NEGATIVE
KETONES UA: NEGATIVE
LEUKOCYTES UA: NEGATIVE
NITRITE UA: NEGATIVE
Protein, UA: NEGATIVE
Specific Gravity, UA: 1.02 (ref 1.005–1.030)
UUROB: 0.2 mg/dL (ref 0.2–1.0)
pH, UA: 7 (ref 5.0–7.5)

## 2018-01-03 MED ORDER — FEXOFENADINE-PSEUDOEPHED ER 180-240 MG PO TB24: 1.0000 | ORAL_TABLET | Freq: Every day | ORAL | 2 refills | Status: DC

## 2018-01-03 MED ORDER — PREGABALIN 75 MG PO CAPS: 75.0000 mg | ORAL_CAPSULE | Freq: Every day | ORAL | 0 refills | Status: DC

## 2018-01-03 MED ORDER — DULOXETINE HCL 30 MG PO CPEP: ORAL_CAPSULE | ORAL | 3 refills | Status: DC

## 2018-01-03 NOTE — Patient Instructions (Signed)
Please schedule a follow up appt with Dr Darlyn ReadStacks in 2 weeks.

## 2018-01-03 NOTE — Progress Notes (Signed)
Subjective:  Patient ID: [name], male    DOB: [birth year]  Age: 37 y.o. MRN: [id number]  CC: Annual Exam (pt here today for CPE and also c/o depression and allergies)   HPI [name] presents for annual physical exam.  He has been doing in a couple of years.  He is a lumbar radiculopathy patient.  He had surgery about 10 years ago with [facility] orthopedic he has had 6-7/10 lumbar radiculopathy pain with radiation through the posterior thigh and calf since that time.  He had been going to a pain clinic until a couple of years ago when he lost his job at [employer].  He is now working again and needs some help with the pain.  Additionally he has had multiple hard knocks either with his marriage and other external factors.  He has become sad and withdrawn.  He is unmotivated.  He has been missing work or being late to work.  He is eating everything in sight and notes that he is gained 40 pounds in the last several months.  This is after having lost down to 199 pounds a few years ago.  His PHQ score below was reviewed in detail.  He feels like a failure because he has been in and that in the job and because he just cannot seem to get ahead and accomplished his goals for his wife his home and his family.  He says he is not sleeping well.  He just wants to be alone and stays withdrawn and does not enjoy activities.  Depression screen [facility] 2/9 01/03/2018 01/08/2015  Decreased Interest 2 0  Down, Depressed, Hopeless 2 0  PHQ - 2 Score 4 0  Altered sleeping 2 -  Tired, decreased energy 3 -  Change in appetite 3 -  Feeling bad or failure about yourself  3 -  Trouble concentrating 2 -  Moving slowly or fidgety/restless 1 -  Suicidal thoughts 0 -  PHQ-9 Score 18 -    History [name] has a past medical history of Back pain, chronic.   He has a past surgical history that includes Back surgery.   His family history is not on file.He reports that he has never smoked. He has never used smokeless  tobacco. He reports that he drinks alcohol. He reports that he does not use drugs.    ROS Review of Systems  Constitutional: Negative.   HENT: Positive for congestion.   Eyes: Negative for visual disturbance.  Respiratory: Negative for cough and shortness of breath.   Cardiovascular: Negative for chest pain and leg swelling.  Gastrointestinal: Positive for abdominal pain (Occasional heartburn and indigestion relieved by Tums after a few hours.  This is not something that he wants to pursue treatment for.  He feels that his times are adequate.  It is limited to once or twice a week.). Negative for diarrhea, nausea and vomiting.  Genitourinary: Negative for decreased urine volume, difficulty urinating, testicular pain and urgency.  Musculoskeletal: Negative for arthralgias and myalgias.  Skin: Negative for rash.  Allergic/Immunologic: Positive for environmental allergies.  Neurological: Negative for headaches.  Psychiatric/Behavioral: Negative for sleep disturbance.    Objective:  BP 114/81   Pulse 78   Temp (!) 97.5 F (36.4 C) (Oral)   Ht '5\' 10"'$  (1.778 m)   Wt 263 lb 2 oz (119.4 kg)   BMI 37.75 kg/m   BP Readings from Last 3 Encounters:  01/03/18 114/81  08/31/16 120/80  09/11/15 120/85  Wt Readings from Last 3 Encounters:  01/03/18 263 lb 2 oz (119.4 kg)  08/31/16 265 lb (120.2 kg)  09/11/15 260 lb (117.9 kg)     Physical Exam  Constitutional: He is oriented to person, place, and time. He appears well-developed and well-nourished.  HENT:  Head: Normocephalic and atraumatic.  Mouth/Throat: Oropharynx is clear and moist.  Eyes: Pupils are equal, round, and reactive to light. EOM are normal.  Neck: Normal range of motion. No tracheal deviation present. No thyromegaly present.  Cardiovascular: Normal rate, regular rhythm and normal heart sounds. Exam reveals no gallop and no friction rub.  No murmur heard. Pulmonary/Chest: Breath sounds normal. He has no wheezes. He  has no rales.  Abdominal: Soft. He exhibits no mass. There is no tenderness. Hernia confirmed negative in the right inguinal area and confirmed negative in the left inguinal area.  Genitourinary: Testes normal and penis normal. Right testis shows no mass, no swelling and no tenderness. Left testis shows no mass, no swelling and no tenderness. Uncircumcised.  Musculoskeletal: Normal range of motion. He exhibits no edema.  Neurological: He is alert and oriented to person, place, and time.  Skin: Skin is warm and dry.  Psychiatric: He has a normal mood and affect.      Assessment & Plan:   [name] was seen today for annual exam.  Diagnoses and all orders for this visit:  Well adult exam -     CBC with Differential/Platelet -     CMP14+EGFR -     Lipid panel -     TSH -     Urinalysis  Lumbar radiculopathy -     CBC with Differential/Platelet -     CMP14+EGFR -     Ambulatory referral to Neurosurgery -     Ambulatory referral to Pain Clinic -     pregabalin (LYRICA) 75 MG capsule; Take 1 capsule (75 mg total) by mouth at bedtime.  Current severe episode of major depressive disorder without psychotic features, unspecified whether recurrent ([place]) -     CBC with Differential/Platelet -     CMP14+EGFR -     TSH -     DULoxetine (CYMBALTA) 30 MG capsule; Take 1 with supper x 1 week then take 2 with supper.  Seasonal allergic rhinitis due to pollen -     CBC with Differential/Platelet -     CMP14+EGFR -     fexofenadine-pseudoephedrine (ALLEGRA-D 24) 180-240 MG 24 hr tablet; Take 1 tablet by mouth daily.       I have discontinued Hilmar D. Swaim's Guaifenesin, promethazine-dextromethorphan, and ibuprofen. I am also having him start on DULoxetine, fexofenadine-pseudoephedrine, and pregabalin.  Allergies as of 01/03/2018      Reactions   Bee Venom Anaphylaxis   Honey Anaphylaxis, Hives   Penicillins Anaphylaxis      Medication List        Accurate as of 01/03/18  3:08 PM.  Always use your most recent med list.          DULoxetine 30 MG capsule Commonly known as:  CYMBALTA Take 1 with supper x 1 week then take 2 with supper.   fexofenadine-pseudoephedrine 180-240 MG 24 hr tablet Commonly known as:  ALLEGRA-D 24 Take 1 tablet by mouth daily.   pregabalin 75 MG capsule Commonly known as:  LYRICA Take 1 capsule (75 mg total) by mouth at bedtime.        Follow-up: Return in about 2 weeks (around 01/17/2018).  Claretta Fraise,  M.D. 

## 2018-01-04 LAB — LIPID PANEL
Chol/HDL Ratio: 3.1 ratio (ref 0.0–5.0)
Cholesterol, Total: 172 mg/dL (ref 100–199)
HDL: 55 mg/dL (ref >39)
LDL Calculated: 86 mg/dL (ref 0–99)
TRIGLYCERIDES: 156 mg/dL — AB (ref 0–149)
VLDL CHOLESTEROL CAL: 31 mg/dL (ref 5–40)

## 2018-01-04 LAB — CBC WITH DIFFERENTIAL/PLATELET
BASOS: 0 %
Basophils Absolute: 0 10*3/uL (ref 0.0–0.2)
EOS (ABSOLUTE): 0.2 10*3/uL (ref 0.0–0.4)
EOS: 2 %
HEMOGLOBIN: 15.6 g/dL (ref 13.0–17.7)
Hematocrit: 45.7 % (ref 37.5–51.0)
Immature Grans (Abs): 0 10*3/uL (ref 0.0–0.1)
Immature Granulocytes: 0 %
Lymphocytes Absolute: 3.2 10*3/uL — ABNORMAL HIGH (ref 0.7–3.1)
Lymphs: 38 %
MCH: 28.3 pg (ref 26.6–33.0)
MCHC: 34.1 g/dL (ref 31.5–35.7)
MCV: 83 fL (ref 79–97)
MONOCYTES: 6 %
Monocytes Absolute: 0.5 10*3/uL (ref 0.1–0.9)
NEUTROS ABS: 4.4 10*3/uL (ref 1.4–7.0)
Neutrophils: 54 %
Platelets: 250 10*3/uL (ref 150–379)
RBC: 5.52 x10E6/uL (ref 4.14–5.80)
RDW: 13.9 % (ref 12.3–15.4)
WBC: 8.3 10*3/uL (ref 3.4–10.8)

## 2018-01-04 LAB — TSH: TSH: 0.607 u[IU]/mL (ref 0.450–4.500)

## 2018-01-04 LAB — CMP14+EGFR
ALK PHOS: 60 IU/L (ref 39–117)
ALT: 22 IU/L (ref 0–44)
AST: 20 IU/L (ref 0–40)
Albumin/Globulin Ratio: 2 (ref 1.2–2.2)
Albumin: 4.9 g/dL (ref 3.5–5.5)
BILIRUBIN TOTAL: 0.3 mg/dL (ref 0.0–1.2)
BUN / CREAT RATIO: 12 (ref 9–20)
BUN: 12 mg/dL (ref 6–20)
CO2: 25 mmol/L (ref 20–29)
Calcium: 10.2 mg/dL (ref 8.7–10.2)
Chloride: 101 mmol/L (ref 96–106)
Creatinine, Ser: 1.01 mg/dL (ref 0.76–1.27)
GFR calc non Af Amer: 95 mL/min/{1.73_m2} (ref >59)
GFR, EST AFRICAN AMERICAN: 109 mL/min/{1.73_m2} (ref >59)
Globulin, Total: 2.5 g/dL (ref 1.5–4.5)
Glucose: 80 mg/dL (ref 65–99)
Potassium: 4.2 mmol/L (ref 3.5–5.2)
Sodium: 141 mmol/L (ref 134–144)
Total Protein: 7.4 g/dL (ref 6.0–8.5)

## 2018-01-12 ENCOUNTER — Telehealth: Payer: Self-pay

## 2018-01-12 NOTE — Telephone Encounter (Signed)
Insurance denied prior auth for Lyrica   Did not approve DX for [place]

## 2018-01-17 ENCOUNTER — Ambulatory Visit (INDEPENDENT_AMBULATORY_CARE_PROVIDER_SITE_OTHER): Payer: BLUE CROSS/BLUE SHIELD | Admitting: Family Medicine

## 2018-01-17 ENCOUNTER — Encounter: Payer: Self-pay | Admitting: Family Medicine

## 2018-01-17 VITALS — BP 113/79 | HR 77 | Temp 97.2°F | Ht 70.0 in | Wt 265.4 lb

## 2018-01-17 DIAGNOSIS — F322 Major depressive disorder, single episode, severe without psychotic features: Secondary | ICD-10-CM | POA: Diagnosis not present

## 2018-01-17 MED ORDER — DULOXETINE HCL 60 MG PO CPEP: 60.0000 mg | ORAL_CAPSULE | Freq: Every day | ORAL | 2 refills | Status: DC

## 2018-01-18 ENCOUNTER — Encounter: Payer: Self-pay | Admitting: Family Medicine

## 2018-01-18 NOTE — Progress Notes (Signed)
Subjective:  Patient ID: [name], male    DOB: 1981/04/17  Age: 37 y.o. MRN: [id number]  CC: Follow-up (pt here today for follow up after starting on Cymbalta which he says it is better)   HPI [name] presents for recheck of his anxiety and depression. He denies side efects of using the medication other than nausea which resolved quickly. Depression decreased during the first week. Only one day of depression during the second week at 60 mg.  Depression screen [facility] 2/9 01/17/2018 01/03/2018 01/08/2015  Decreased Interest 1 2 0  Down, Depressed, Hopeless 1 2 0  PHQ - 2 Score 2 4 0  Altered sleeping 1 2 -  Tired, decreased energy 1 3 -  Change in appetite 1 3 -  Feeling bad or failure about yourself  1 3 -  Trouble concentrating 1 2 -  Moving slowly or fidgety/restless 0 1 -  Suicidal thoughts 0 0 -  PHQ-9 Score 7 18 -    History [name] has a past medical history of Back pain, chronic.   He has a past surgical history that includes Back surgery.   His family history is not on file.He reports that he has never smoked. He has never used smokeless tobacco. He reports that he drinks alcohol. He reports that he does not use drugs.    ROS Review of Systems  Constitutional: Negative for fever.  Respiratory: Negative for shortness of breath.   Cardiovascular: Negative for chest pain.  Musculoskeletal: Negative for arthralgias.  Skin: Negative for rash.    Objective:  BP 113/79   Pulse 77   Temp (!) 97.2 F (36.2 C) (Oral)   Ht  (1.778 m)   Wt 265 lb 6 oz (120.4 kg)   BMI 38.08 kg/m   BP Readings from Last 3 Encounters:  01/17/18 113/79  01/03/18 114/81  08/31/16 120/80    Wt Readings from Last 3 Encounters:  01/17/18 265 lb 6 oz (120.4 kg)  01/03/18 263 lb 2 oz (119.4 kg)  08/31/16 265 lb (120.2 kg)     Physical Exam  Constitutional: He appears well-developed and well-nourished.  HENT:  Head: Normocephalic and atraumatic.  Right Ear: Tympanic  membrane and external ear normal. No decreased hearing is noted.  Left Ear: Tympanic membrane and external ear normal. No decreased hearing is noted.  Mouth/Throat: No oropharyngeal exudate or posterior oropharyngeal erythema.  Eyes: Pupils are equal, round, and reactive to light.  Neck: Normal range of motion. Neck supple.  Cardiovascular: Normal rate and regular rhythm.  No murmur heard. Pulmonary/Chest: Breath sounds normal. No respiratory distress.  Abdominal: Soft. Bowel sounds are normal. He exhibits no mass. There is no tenderness.  Vitals reviewed.     Assessment & Plan:   [name] was seen today for follow-up.  Diagnoses and all orders for this visit:  Current severe episode of major depressive disorder without psychotic features, unspecified whether recurrent ([facility])  Other orders -     DULoxetine (CYMBALTA) 60 MG capsule; Take 1 capsule (60 mg total) by mouth daily.       I have discontinued [name]'s DULoxetine and pregabalin. I am also having him start on DULoxetine. Additionally, I am having him maintain his fexofenadine-pseudoephedrine.  Allergies as of 01/17/2018      Reactions   Bee Venom Anaphylaxis   Honey Anaphylaxis, Hives   Penicillins Anaphylaxis      Medication List        Accurate  as of 01/17/18 11:59 PM. Always use your most recent med list.          DULoxetine 60 MG capsule Commonly known as:  CYMBALTA Take 1 capsule (60 mg total) by mouth daily.   fexofenadine-pseudoephedrine 180-240 MG 24 hr tablet Commonly known as:  ALLEGRA-D 24 Take 1 tablet by mouth daily.        Follow-up: No follow-ups on file.  Mechele Claude, M.D.

## 2018-02-17 ENCOUNTER — Ambulatory Visit (INDEPENDENT_AMBULATORY_CARE_PROVIDER_SITE_OTHER): Payer: BLUE CROSS/BLUE SHIELD | Admitting: Family Medicine

## 2018-02-17 ENCOUNTER — Encounter: Payer: Self-pay | Admitting: Family Medicine

## 2018-02-17 VITALS — BP 113/80 | HR 80 | Temp 97.6°F | Ht 70.0 in | Wt 269.2 lb

## 2018-02-17 DIAGNOSIS — F5102 Adjustment insomnia: Secondary | ICD-10-CM

## 2018-02-17 DIAGNOSIS — F322 Major depressive disorder, single episode, severe without psychotic features: Secondary | ICD-10-CM

## 2018-02-17 DIAGNOSIS — M5416 Radiculopathy, lumbar region: Secondary | ICD-10-CM

## 2018-02-17 MED ORDER — GABAPENTIN 300 MG PO CAPS: ORAL_CAPSULE | ORAL | 0 refills | Status: DC

## 2018-02-17 MED ORDER — PSYLLIUM 58.6 % PO POWD: ORAL | 12 refills | Status: DC

## 2018-02-17 MED ORDER — TRIAMCINOLONE ACETONIDE 0.1 % EX CREA: 1.0000 "application " | TOPICAL_CREAM | Freq: Three times a day (TID) | CUTANEOUS | 0 refills | Status: DC

## 2018-02-17 NOTE — Progress Notes (Signed)
Subjective:  Patient ID: [name], male    DOB: 1981-05-28  Age: 37 y.o. MRN: [id number]  CC: Follow-up (pt here today for 1 month follow up and also has something on his foot he would like looked at)   HPI Webb D Duty presents for recheck of depression. No improvement. Cymbalta not working well. Has sx noted below per PHQ. Also sleeping less, waking at 3 AM & unable to get back to sleep.   [facility]/17/2018 01/17/2018 01/03/2018  Decreased Interest 1 1 2   Down, Depressed, Hopeless 1 1 2   PHQ - 2 Score 2 2 4   Altered sleeping 2 1 2   Tired, decreased energy 2 1 3   Change in appetite 2 1 3   Feeling bad or failure about yourself  1 1 3   Trouble concentrating 1 1 2   Moving slowly or fidgety/restless 0 0 1  Suicidal thoughts 0 0 0  PHQ-9 Score 10 7 18   Difficult doing work/chores Somewhat difficult - -    History Benn has a past medical history of Back pain, chronic.   He has a past surgical history that includes Back surgery.   His family history is not on file.He reports that he has never smoked. He has never used smokeless tobacco. He reports that he drinks alcohol. He reports that he does not use drugs.    ROS Review of Systems  Constitutional: Negative for fever.  Respiratory: Negative for shortness of breath.   Cardiovascular: Negative for chest pain.  Musculoskeletal: Positive for arthralgias and back pain. Negative for myalgias.  Skin: Negative for rash.  Psychiatric/Behavioral: Positive for dysphoric mood and sleep disturbance. The patient is nervous/anxious.     Objective:  BP 113/80   Pulse 80   Temp 97.6 F (36.4 C) (Oral)   Ht 5\' 10"  (1.778 m)   Wt 269 lb 4 oz (122.1 kg)   BMI 38.63 kg/m   BP Readings from Last 3 Encounters:  02/17/18 113/80  01/17/18 113/79  01/03/18 114/81    Wt Readings from Last 3 Encounters:  02/17/18 269 lb 4 oz (122.1 kg)  01/17/18 265 lb 6 oz (120.4 kg)  01/03/18 263 lb 2 oz (119.4 kg)     Physical  Exam  Constitutional: He is oriented to person, place, and time. He appears well-developed and well-nourished.  HENT:  Head: Normocephalic and atraumatic.  Right Ear: External ear normal.  Left Ear: External ear normal.  Mouth/Throat: No oropharyngeal exudate or posterior oropharyngeal erythema.  Eyes: Pupils are equal, round, and reactive to light.  Neck: Normal range of motion. Neck supple.  Cardiovascular: Normal rate and regular rhythm.  No murmur heard. Pulmonary/Chest: Breath sounds normal. No respiratory distress.  Musculoskeletal: Normal range of motion. He exhibits tenderness (midline lower back).  Neurological: He is alert and oriented to person, place, and time.  Vitals reviewed.     Assessment & Plan:   Aurelio BrashJoey was seen today for follow-up.  Diagnoses and all orders for this visit:  Current severe episode of major depressive disorder without psychotic features, unspecified whether recurrent (HCC)  Lumbar radiculopathy  Adjustment insomnia  Other orders -     triamcinolone cream (KENALOG) 0.1 %; Apply 1 application topically 3 (three) times daily. -     gabapentin (NEURONTIN) 300 MG capsule; Take 1 PO QHS x 1 week, then 2 PO QHS X 1 week, then 3PO QHS x 1 week, then 4PO QHS. -     psyllium (METAMUCIL  SMOOTH TEXTURE) 58.6 % powder; Take 1 tablespoon daily (OTC)       I am having Nassir D. Bayer start on triamcinolone cream, gabapentin, and psyllium. I am also having him maintain his fexofenadine-pseudoephedrine and DULoxetine.  Allergies as of 02/17/2018      Reactions   Bee Venom Anaphylaxis   Honey Anaphylaxis, Hives   Penicillins Anaphylaxis      Medication List        Accurate as of 02/17/18 11:59 PM. Always use your most recent med list.          DULoxetine 60 MG capsule Commonly known as:  CYMBALTA Take 1 capsule (60 mg total) by mouth daily.   fexofenadine-pseudoephedrine 180-240 MG 24 hr tablet Commonly known as:  ALLEGRA-D 24 Take 1 tablet by  mouth daily.   gabapentin 300 MG capsule Commonly known as:  NEURONTIN Take 1 PO QHS x 1 week, then 2 PO QHS X 1 week, then 3PO QHS x 1 week, then 4PO QHS.   psyllium 58.6 % powder Commonly known as:  METAMUCIL SMOOTH TEXTURE Take 1 tablespoon daily (OTC)   triamcinolone cream 0.1 % Commonly known as:  KENALOG Apply 1 application topically 3 (three) times daily.      For sleep try taking the cymbalta early in the day. Supplement with Tylenol PM.  Follow-up: Return in about 1 month (around 03/19/2018).  Mechele Claude, M.D.

## 2018-02-17 NOTE — Patient Instructions (Addendum)

## 2018-02-26 ENCOUNTER — Encounter: Payer: Self-pay | Admitting: Family Medicine

## 2018-03-22 ENCOUNTER — Encounter: Payer: Self-pay | Admitting: Family Medicine

## 2018-03-22 ENCOUNTER — Ambulatory Visit (INDEPENDENT_AMBULATORY_CARE_PROVIDER_SITE_OTHER): Payer: BLUE CROSS/BLUE SHIELD | Admitting: Family Medicine

## 2018-03-22 VITALS — BP 106/74 | HR 76 | Temp 98.7°F | Ht 70.0 in | Wt 273.1 lb

## 2018-03-22 DIAGNOSIS — F324 Major depressive disorder, single episode, in partial remission: Secondary | ICD-10-CM

## 2018-03-22 DIAGNOSIS — M5416 Radiculopathy, lumbar region: Secondary | ICD-10-CM

## 2018-03-22 DIAGNOSIS — F5102 Adjustment insomnia: Secondary | ICD-10-CM

## 2018-03-22 MED ORDER — GABAPENTIN 300 MG PO CAPS: 900.0000 mg | ORAL_CAPSULE | Freq: Two times a day (BID) | ORAL | 1 refills | Status: DC

## 2018-03-22 MED ORDER — MIRTAZAPINE 15 MG PO TABS: 15.0000 mg | ORAL_TABLET | Freq: Every day | ORAL | 1 refills | Status: DC

## 2018-03-22 NOTE — Progress Notes (Signed)
Subjective:  Patient ID: [name], male    DOB: December 23, 1980  Age: 37 y.o. MRN: [id number]  CC: No chief complaint on file.   HPI [name] presents for his depression and sleep as well as the sciatica pain.  His overall demeanor has improved.  But he still has times when he has decreased interest in routine activities.  He states that a week ago when his family went for Fourth of July fireworks display he was disinterested so he stayed home.  On the other hand he feels like he is sleeping somewhat better he is tired less and his symptoms are less common.  His concentration is back to normal for his routine activities at work.  The gabapentin agrees with him well.  It does not interfere with sleep.  He is worked his way up to 4 tablets at bedtime a total of 1200 mg and that has not impacted his sleep nor has an impact of the sciatica.  Pain is still moderate.  Additionally patient tells me that he goes to bed and gets to sleep okay but he has early awakening usually between 3 and 4 AM and cannot get back to sleep.  He is very tired through the day generally by midafternoon.  [facility] 03/22/2018 02/17/2018 01/17/2018  Decreased Interest 1 1 1   Down, Depressed, Hopeless 1 1 1   PHQ - 2 Score 2 2 2   Altered sleeping 1 2 1   Tired, decreased energy 1 2 1   Change in appetite 0 2 1  Feeling bad or failure about yourself  1 1 1   Trouble concentrating 0 1 1  Moving slowly or fidgety/restless 0 0 0  Suicidal thoughts 0 0 0  PHQ-9 Score 5 10 7   Difficult doing work/chores Somewhat difficult Somewhat difficult -    History [name] has a past medical history of Back pain, chronic.   He has a past surgical history that includes Back surgery.   His family history is not on file.He reports that he has never smoked. He has never used smokeless tobacco. He reports that he drinks alcohol. He reports that he does not use drugs.    ROS Review of Systems  Constitutional: Negative for  fever.  Respiratory: Negative for shortness of breath.   Cardiovascular: Negative for chest pain.  Musculoskeletal: Positive for back pain (With right-sided sciatica). Negative for arthralgias.  Skin: Negative for rash.  Psychiatric/Behavioral: Positive for dysphoric mood and sleep disturbance.    Objective:  BP 106/74   Pulse 76   Temp 98.7 F (37.1 C) (Oral)   Ht 5\' 10"  (1.778 m)   Wt 273 lb 2 oz (123.9 kg)   BMI 39.19 kg/m   BP Readings from Last 3 Encounters:  03/22/18 106/74  02/17/18 113/80  01/17/18 113/79    Wt Readings from Last 3 Encounters:  03/22/18 273 lb 2 oz (123.9 kg)  02/17/18 269 lb 4 oz (122.1 kg)  01/17/18 265 lb 6 oz (120.4 kg)     Physical Exam  Constitutional: He is oriented to person, place, and time. He appears well-developed and well-nourished. No distress.  HENT:  Head: Normocephalic and atraumatic.  Eyes: Pupils are equal, round, and reactive to light. Conjunctivae and EOM are normal.  Neck: Normal range of motion. Neck supple.  Cardiovascular: Normal rate, regular rhythm and normal heart sounds.  No murmur heard. Pulmonary/Chest: Effort normal and breath sounds normal. No respiratory distress. He has no wheezes. He has  no rales.  Musculoskeletal: Normal range of motion. He exhibits no edema or deformity.  Neurological: He is alert and oriented to person, place, and time. He has normal reflexes.  Skin: Skin is warm and dry.  Psychiatric: He has a normal mood and affect. His behavior is normal. Judgment and thought content normal.      Assessment & Plan:   Diagnoses and all orders for this visit:  Major depressive disorder with single episode, in partial remission (HCC)  Lumbar radiculopathy  Adjustment insomnia  Other orders -     mirtazapine (REMERON) 15 MG tablet; Take 1 tablet (15 mg total) by mouth at bedtime. -     gabapentin (NEURONTIN) 300 MG capsule; Take 3 capsules (900 mg total) by mouth 2 (two) times daily.     I  have changed Dalyn D. Bouton's gabapentin. I am also having him start on mirtazapine. Additionally, I am having him maintain his fexofenadine-pseudoephedrine, DULoxetine, triamcinolone cream, and psyllium.  Allergies as of 03/22/2018      Reactions   Bee Venom Anaphylaxis   Honey Anaphylaxis, Hives   Penicillins Anaphylaxis      Medication List        Accurate as of 03/22/18  5:35 PM. Always use your most recent med list.          DULoxetine 60 MG capsule Commonly known as:  CYMBALTA Take 1 capsule (60 mg total) by mouth daily.   fexofenadine-pseudoephedrine 180-240 MG 24 hr tablet Commonly known as:  ALLEGRA-D 24 Take 1 tablet by mouth daily.   gabapentin 300 MG capsule Commonly known as:  NEURONTIN Take 3 capsules (900 mg total) by mouth 2 (two) times daily.   mirtazapine 15 MG tablet Commonly known as:  REMERON Take 1 tablet (15 mg total) by mouth at bedtime.   psyllium 58.6 % powder Commonly known as:  METAMUCIL SMOOTH TEXTURE Take 1 tablespoon daily (OTC)   triamcinolone cream 0.1 % Commonly known as:  KENALOG Apply 1 application topically 3 (three) times daily.        Follow-up: Return in about 1 month (around 04/22/2018).  Mechele Claude, M.D.

## 2018-04-18 ENCOUNTER — Encounter: Payer: Self-pay | Admitting: Family Medicine

## 2018-04-18 ENCOUNTER — Ambulatory Visit (HOSPITAL_COMMUNITY)
Admission: RE | Admit: 2018-04-18 | Discharge: 2018-04-18 | Disposition: A | Discharge status: Home / Self Care | Payer: BLUE CROSS/BLUE SHIELD | Source: Ambulatory Visit | Attending: Family Medicine | Admitting: Family Medicine

## 2018-04-18 ENCOUNTER — Ambulatory Visit (INDEPENDENT_AMBULATORY_CARE_PROVIDER_SITE_OTHER): Payer: BLUE CROSS/BLUE SHIELD

## 2018-04-18 ENCOUNTER — Telehealth: Payer: Self-pay | Admitting: Family Medicine

## 2018-04-18 ENCOUNTER — Ambulatory Visit (INDEPENDENT_AMBULATORY_CARE_PROVIDER_SITE_OTHER): Payer: BLUE CROSS/BLUE SHIELD | Admitting: Family Medicine

## 2018-04-18 VITALS — BP 120/87 | HR 71 | Temp 97.0°F | Ht 70.0 in | Wt 278.1 lb

## 2018-04-18 DIAGNOSIS — N200 Calculus of kidney: Secondary | ICD-10-CM | POA: Insufficient documentation

## 2018-04-18 DIAGNOSIS — R109 Unspecified abdominal pain: Secondary | ICD-10-CM

## 2018-04-18 DIAGNOSIS — R1031 Right lower quadrant pain: Secondary | ICD-10-CM

## 2018-04-18 DIAGNOSIS — M5137 Other intervertebral disc degeneration, lumbosacral region: Secondary | ICD-10-CM | POA: Insufficient documentation

## 2018-04-18 DIAGNOSIS — K5730 Diverticulosis of large intestine without perforation or abscess without bleeding: Secondary | ICD-10-CM | POA: Insufficient documentation

## 2018-04-18 DIAGNOSIS — R197 Diarrhea, unspecified: Secondary | ICD-10-CM | POA: Diagnosis not present

## 2018-04-18 LAB — URINALYSIS
Bilirubin, UA: NEGATIVE
Glucose, UA: NEGATIVE
Ketones, UA: NEGATIVE
Leukocytes, UA: NEGATIVE
NITRITE UA: NEGATIVE
PH UA: 7.5 (ref 5.0–7.5)
Protein, UA: NEGATIVE
RBC UA: NEGATIVE
SPEC GRAV UA: 1.02 (ref 1.005–1.030)
Urobilinogen, Ur: 0.2 mg/dL (ref 0.2–1.0)

## 2018-04-18 MED ORDER — DULOXETINE HCL 60 MG PO CPEP: 60.0000 mg | ORAL_CAPSULE | Freq: Every day | ORAL | 0 refills | Status: DC

## 2018-04-18 MED ORDER — METRONIDAZOLE 500 MG PO TABS: 500.0000 mg | ORAL_TABLET | Freq: Three times a day (TID) | ORAL | 0 refills | Status: DC

## 2018-04-18 MED ORDER — CIPROFLOXACIN HCL 500 MG PO TABS: 500.0000 mg | ORAL_TABLET | Freq: Two times a day (BID) | ORAL | 0 refills | Status: DC

## 2018-04-18 MED ORDER — IOPAMIDOL (ISOVUE-300) INJECTION 61%
100.0000 mL | Freq: Once | INTRAVENOUS | Status: AC | PRN
  Administered 2018-04-18: 100 mL via INTRAVENOUS

## 2018-04-18 NOTE — Progress Notes (Signed)
Subjective:  Patient ID: [name], male    DOB: [birth year]  Age: 37 y.o. MRN: [id number]  CC: Nausea and Diarrhea   HPI YOSEF KROGH presents for onset on the morning of August 4 with severe nausea but no vomiting.  Appetite's been poor.  He has significant heartburn as well.  Since that time he has been having 5-6 watery bowel movements a day.  He actually had 5 or 6 this morning before being seen in the office at 11 AM.  He is having severe abdominal pain.  He points toward the left lower quadrant.  Depression screen [facility] 2/9 04/18/2018 03/22/2018 02/17/2018  Decreased Interest '1 1 1  ' Down, Depressed, Hopeless '1 1 1  ' PHQ - 2 Score '2 2 2  ' Altered sleeping 0 1 2  Tired, decreased energy '1 1 2  ' Change in appetite 1 0 2  Feeling bad or failure about yourself  '1 1 1  ' Trouble concentrating 0 0 1  Moving slowly or fidgety/restless 0 0 0  Suicidal thoughts 0 0 0  PHQ-9 Score '5 5 10  ' Difficult doing work/chores - Somewhat difficult Somewhat difficult    History Sheryl has a past medical history of Back pain, chronic.   He has a past surgical history that includes Back surgery.   His family history is not on file.He reports that he has never smoked. He has never used smokeless tobacco. He reports that he drinks alcohol. He reports that he does not use drugs.    ROS Review of Systems  Constitutional: Positive for activity change and appetite change. Negative for diaphoresis and fever.  HENT: Negative.   Respiratory: Negative for cough, chest tightness and shortness of breath.   Cardiovascular: Negative for chest pain.  Gastrointestinal: Positive for abdominal distention, abdominal pain, diarrhea and nausea. Negative for anal bleeding, blood in stool, constipation, rectal pain and vomiting.  Genitourinary: Negative.   Neurological: Negative for syncope, weakness, light-headedness and numbness.  Psychiatric/Behavioral: Negative.     Objective:  BP 120/87   Pulse 71   Temp (!) 97 F  (36.1 C) (Oral)   Ht '5\' 10"'  (1.778 m)   Wt 278 lb 2 oz (126.2 kg)   BMI 39.91 kg/m    BP Readings from Last 3 Encounters:  04/18/18 120/87  03/22/18 106/74  02/17/18 113/80    Wt Readings from Last 3 Encounters:  04/18/18 278 lb 2 oz (126.2 kg)  03/22/18 273 lb 2 oz (123.9 kg)  02/17/18 269 lb 4 oz (122.1 kg)     Physical Exam  Constitutional: He appears well-developed and well-nourished. He appears distressed.  HENT:  Head: Normocephalic and atraumatic.  Eyes: Pupils are equal, round, and reactive to light. EOM are normal.  Neck: Normal range of motion.  Cardiovascular: Normal rate, regular rhythm and normal heart sounds.  Pulmonary/Chest: Effort normal and breath sounds normal.  Abdominal: Soft. Bowel sounds are normal. He exhibits distension. He exhibits no mass. There is tenderness (The tenderness is diffuse but worse at the McBurney's point.  Also significant in the left lower quadrant). There is no rebound and no guarding. No hernia.  Musculoskeletal: Normal range of motion.  Skin: Skin is warm and dry. No rash noted. No erythema.     Urine parameters normal on report.  Abdominal x-ray revealed increased stool only no obstruction.  CT of the abdomen to rule out appendicitis was negative.  There was some diverticulosis noted. Assessment & Plan:   Barnabas was  seen today for nausea and diarrhea.  Diagnoses and all orders for this visit:  Abdominal pain, unspecified abdominal location -     DG Abd 2 Views; Future -     Urine Culture -     Urinalysis  Right lower quadrant abdominal pain -     CBC with Differential/Platelet -     CMP14+EGFR -     Cancel: CT ABDOMEN W WO CONTRAST; Future -     CT ABDOMEN PELVIS W CONTRAST; Future  Other orders -     DULoxetine (CYMBALTA) 60 MG capsule; Take 1 capsule (60 mg total) by mouth daily. -     metroNIDAZOLE (FLAGYL) 500 MG tablet; Take 1 tablet (500 mg total) by mouth 3 (three) times daily. -     ciprofloxacin (CIPRO) 500 MG  tablet; Take 1 tablet (500 mg total) by mouth 2 (two) times daily.       I have discontinued Press D. Hearst's fexofenadine-pseudoephedrine and psyllium. I am also having him start on metroNIDAZOLE and ciprofloxacin. Additionally, I am having him maintain his triamcinolone cream, mirtazapine, gabapentin, and DULoxetine.  Allergies as of 04/18/2018      Reactions   Bee Venom Anaphylaxis   Honey Anaphylaxis, Hives   Penicillins Anaphylaxis      Medication List        Accurate as of 04/18/18  6:45 PM. Always use your most recent med list.          ciprofloxacin 500 MG tablet Commonly known as:  CIPRO Take 1 tablet (500 mg total) by mouth 2 (two) times daily.   DULoxetine 60 MG capsule Commonly known as:  CYMBALTA Take 1 capsule (60 mg total) by mouth daily.   gabapentin 300 MG capsule Commonly known as:  NEURONTIN Take 3 capsules (900 mg total) by mouth 2 (two) times daily.   metroNIDAZOLE 500 MG tablet Commonly known as:  FLAGYL Take 1 tablet (500 mg total) by mouth 3 (three) times daily.   mirtazapine 15 MG tablet Commonly known as:  REMERON Take 1 tablet (15 mg total) by mouth at bedtime.   triamcinolone cream 0.1 % Commonly known as:  KENALOG Apply 1 application topically 3 (three) times daily.      Appendicitis has been effectively ruled out however with his pain will follow him closely.  Due to the increased stool noted in the colon on x-ray, I do not want to give him any antidiarrheals just yet.  Follow-up: Return in about 2 days (around 04/20/2018).  Claretta Fraise, M.D.

## 2018-04-18 NOTE — Telephone Encounter (Signed)
Pt aware and also scheduled f/u appt with Dr Darlyn ReadStacks 04/20/18 at 9:55.

## 2018-04-18 NOTE — Telephone Encounter (Signed)
He has diverticulitis - an infection of the colon. I sent in antibiotics. Please write the note for work as requested. Thanks, WS

## 2018-04-19 LAB — CMP14+EGFR
ALBUMIN: 4.6 g/dL (ref 3.5–5.5)
ALT: 28 IU/L (ref 0–44)
AST: 21 IU/L (ref 0–40)
Albumin/Globulin Ratio: 1.8 (ref 1.2–2.2)
Alkaline Phosphatase: 64 IU/L (ref 39–117)
BUN/Creatinine Ratio: 12 (ref 9–20)
BUN: 10 mg/dL (ref 6–20)
Bilirubin Total: 0.2 mg/dL (ref 0.0–1.2)
CO2: 23 mmol/L (ref 20–29)
CREATININE: 0.82 mg/dL (ref 0.76–1.27)
Calcium: 10.3 mg/dL — ABNORMAL HIGH (ref 8.7–10.2)
Chloride: 100 mmol/L (ref 96–106)
GFR calc Af Amer: 131 mL/min/{1.73_m2} (ref >59)
GFR calc non Af Amer: 113 mL/min/{1.73_m2} (ref >59)
GLUCOSE: 86 mg/dL (ref 65–99)
Globulin, Total: 2.6 g/dL (ref 1.5–4.5)
Potassium: 4.5 mmol/L (ref 3.5–5.2)
Sodium: 142 mmol/L (ref 134–144)
Total Protein: 7.2 g/dL (ref 6.0–8.5)

## 2018-04-19 LAB — CBC WITH DIFFERENTIAL/PLATELET
BASOS ABS: 0 10*3/uL (ref 0.0–0.2)
Basos: 0 %
EOS (ABSOLUTE): 0.2 10*3/uL (ref 0.0–0.4)
Eos: 3 %
HEMOGLOBIN: 15.2 g/dL (ref 13.0–17.7)
Hematocrit: 47.6 % (ref 37.5–51.0)
Immature Grans (Abs): 0 10*3/uL (ref 0.0–0.1)
Immature Granulocytes: 0 %
LYMPHS ABS: 2.9 10*3/uL (ref 0.7–3.1)
LYMPHS: 35 %
MCH: 27.1 pg (ref 26.6–33.0)
MCHC: 31.9 g/dL (ref 31.5–35.7)
MCV: 85 fL (ref 79–97)
Monocytes Absolute: 0.4 10*3/uL (ref 0.1–0.9)
Monocytes: 5 %
Neutrophils Absolute: 4.8 10*3/uL (ref 1.4–7.0)
Neutrophils: 57 %
PLATELETS: 249 10*3/uL (ref 150–450)
RBC: 5.6 x10E6/uL (ref 4.14–5.80)
RDW: 14.3 % (ref 12.3–15.4)
WBC: 8.3 10*3/uL (ref 3.4–10.8)

## 2018-04-19 LAB — URINE CULTURE: ORGANISM ID, BACTERIA: NO GROWTH

## 2018-04-20 ENCOUNTER — Ambulatory Visit (INDEPENDENT_AMBULATORY_CARE_PROVIDER_SITE_OTHER): Payer: BLUE CROSS/BLUE SHIELD | Admitting: Family Medicine

## 2018-04-20 ENCOUNTER — Encounter: Payer: Self-pay | Admitting: Family Medicine

## 2018-04-20 VITALS — BP 119/84 | HR 82 | Temp 97.2°F | Ht 70.0 in | Wt 280.5 lb

## 2018-04-20 DIAGNOSIS — R1030 Lower abdominal pain, unspecified: Secondary | ICD-10-CM

## 2018-04-20 MED ORDER — MIRTAZAPINE 7.5 MG PO TABS: 7.5000 mg | ORAL_TABLET | Freq: Every day | ORAL | 5 refills | Status: DC

## 2018-04-20 NOTE — Progress Notes (Signed)
Subjective:  Patient ID: [name], male    DOB: 11-Dec-1980  Age: 37 y.o. MRN: [id number]  CC: Abdominal Pain (pt here today for 2 day f/u of Diverticulitis)   HPI [name] presents for recheck of his abdominal pain.  He has had some diffuse pain 2 days ago that was quite severe and focused on the right lower quadrant although it was significant in the left lower quadrant.  Since that time he has been taking metronidazole and ciprofloxacin.  He is tolerating them well except they seem to make him a bit drowsy.  His pain has dropped from 7-8/10 to 3-4/10 and it is equally both lower quadrants.  He has been eating a lot of knots prior to onset he now tells me.  He is been able to eat normal foods for the last 24 hours.  He has not had any nausea or vomiting however he has had some loose stools 2-3 a day.  Decreasing in frequency but he is having some tenesmus.  Depression screen [facility] 2/9 04/18/2018 03/22/2018 02/17/2018  Decreased Interest 1 1 1   Down, Depressed, Hopeless 1 1 1   PHQ - 2 Score 2 2 2   Altered sleeping 0 1 2  Tired, decreased energy 1 1 2   Change in appetite 1 0 2  Feeling bad or failure about yourself  1 1 1   Trouble concentrating 0 0 1  Moving slowly or fidgety/restless 0 0 0  Suicidal thoughts 0 0 0  PHQ-9 Score 5 5 10   Difficult doing work/chores - Somewhat difficult Somewhat difficult    History [name] has a past medical history of Back pain, chronic.   He has a past surgical history that includes Back surgery.   His family history is not on file.He reports that he has never smoked. He has never used smokeless tobacco. He reports that he drinks alcohol. He reports that he does not use drugs.    ROS Review of Systems  Constitutional: Negative for chills, diaphoresis, fever and unexpected weight change.  HENT: Negative for rhinorrhea and trouble swallowing.   Respiratory: Negative for cough, chest tightness and shortness of breath.   Cardiovascular: Negative for  chest pain.  Gastrointestinal: Positive for abdominal pain. Negative for abdominal distention, blood in stool, constipation, diarrhea, nausea, rectal pain and vomiting.  Genitourinary: Negative for dysuria, flank pain and hematuria.  Musculoskeletal: Negative for arthralgias and joint swelling.  Skin: Negative for rash.  Neurological: Negative for syncope and headaches.    Objective:  BP 119/84   Pulse 82   Temp (!) 97.2 F (36.2 C) (Oral)   Ht 5\' 10"  (1.778 m)   Wt 280 lb 8 oz (127.2 kg)   BMI 40.25 kg/m   BP Readings from Last 3 Encounters:  04/20/18 119/84  04/18/18 120/87  03/22/18 106/74    Wt Readings from Last 3 Encounters:  04/20/18 280 lb 8 oz (127.2 kg)  04/18/18 278 lb 2 oz (126.2 kg)  03/22/18 273 lb 2 oz (123.9 kg)     Physical Exam  Constitutional: He appears well-developed and well-nourished.  HENT:  Head: Normocephalic and atraumatic.  Right Ear: Tympanic membrane and external ear normal. No decreased hearing is noted.  Left Ear: Tympanic membrane and external ear normal. No decreased hearing is noted.  Mouth/Throat: No oropharyngeal exudate or posterior oropharyngeal erythema.  Eyes: Pupils are equal, round, and reactive to light.  Neck: Normal range of motion. Neck supple.  Cardiovascular: Normal rate and regular  rhythm.  No murmur heard. Pulmonary/Chest: Breath sounds normal. No respiratory distress.  Abdominal: Soft. Bowel sounds are normal. He exhibits no mass. There is no hepatosplenomegaly. There is tenderness in the right lower quadrant, suprapubic area and left lower quadrant. There is tenderness at McBurney's point (This is mild and decreased from 2 days ago.). There is no rigidity, no rebound, no guarding and negative Murphy's sign.  Vitals reviewed.   CT abdomen reviewed.  No sign of appendicitis or other acute pathology diverticulosis was confirmed.  Assessment & Plan:   Aurelio BrashJoey was seen today for abdominal pain.  Diagnoses and all orders  for this visit:  Lower abdominal pain  Other orders -     mirtazapine (REMERON) 7.5 MG tablet; Take 1 tablet (7.5 mg total) by mouth at bedtime.       I have discontinued Jodie D. Tubbs's mirtazapine. I am also having him start on mirtazapine. Additionally, I am having him maintain his triamcinolone cream, gabapentin, DULoxetine, metroNIDAZOLE, and ciprofloxacin.  Allergies as of 04/20/2018      Reactions   Bee Venom Anaphylaxis   Honey Anaphylaxis, Hives   Penicillins Anaphylaxis      Medication List        Accurate as of 04/20/18 12:41 PM. Always use your most recent med list.          ciprofloxacin 500 MG tablet Commonly known as:  CIPRO Take 1 tablet (500 mg total) by mouth 2 (two) times daily.   DULoxetine 60 MG capsule Commonly known as:  CYMBALTA Take 1 capsule (60 mg total) by mouth daily.   gabapentin 300 MG capsule Commonly known as:  NEURONTIN Take 3 capsules (900 mg total) by mouth 2 (two) times daily.   metroNIDAZOLE 500 MG tablet Commonly known as:  FLAGYL Take 1 tablet (500 mg total) by mouth 3 (three) times daily.   mirtazapine 7.5 MG tablet Commonly known as:  REMERON Take 1 tablet (7.5 mg total) by mouth at bedtime.   triamcinolone cream 0.1 % Commonly known as:  KENALOG Apply 1 application topically 3 (three) times daily.        Follow-up: Return in about 3 months (around 07/21/2018), or if symptoms worsen or fail to improve.  Richard ClaudeWarren Alizee Maple, M.D.

## 2018-04-21 ENCOUNTER — Telehealth: Payer: Self-pay | Admitting: Family Medicine

## 2018-04-21 DIAGNOSIS — Z029 Encounter for administrative examinations, unspecified: Secondary | ICD-10-CM

## 2018-04-24 ENCOUNTER — Telehealth: Payer: Self-pay | Admitting: Family Medicine

## 2018-04-24 MED ORDER — MIRTAZAPINE 15 MG PO TABS: ORAL_TABLET | ORAL | 2 refills | Status: DC

## 2018-04-24 NOTE — Telephone Encounter (Signed)
Please contact the patient see if he can break a 15 mg in half.

## 2018-04-24 NOTE — Telephone Encounter (Signed)
Patient is agreeable to take 15mg  tablets and break in half. Will send in rx. Also requesting a note for work for today and tomorrow. He is off Wednesday and will return Thursday. Note left up front for patient to pick up.

## 2018-04-24 NOTE — Telephone Encounter (Signed)
Pt had got disconnected with Paula ComptonKarla and pt is calling back to speak with her

## 2018-04-24 NOTE — Telephone Encounter (Signed)
Pt states that his insurance will not cover mirtazapine (REMERON) 7.5 MG tablet  And [facility] to [facility] [place]  Insurance said they will not cover 7.5 mg pt said that they was paying for the 15 when he was taking that

## 2018-04-28 ENCOUNTER — Ambulatory Visit: Payer: BLUE CROSS/BLUE SHIELD | Admitting: Family Medicine

## 2018-05-01 ENCOUNTER — Ambulatory Visit: Payer: BLUE CROSS/BLUE SHIELD | Admitting: Family Medicine

## 2018-05-09 ENCOUNTER — Ambulatory Visit (INDEPENDENT_AMBULATORY_CARE_PROVIDER_SITE_OTHER): Payer: BLUE CROSS/BLUE SHIELD | Admitting: Family Medicine

## 2018-05-09 ENCOUNTER — Encounter: Payer: Self-pay | Admitting: Family Medicine

## 2018-05-09 VITALS — BP 118/79 | HR 94 | Temp 97.3°F | Ht 70.0 in | Wt 285.0 lb

## 2018-05-09 DIAGNOSIS — B9789 Other viral agents as the cause of diseases classified elsewhere: Secondary | ICD-10-CM | POA: Diagnosis not present

## 2018-05-09 DIAGNOSIS — J069 Acute upper respiratory infection, unspecified: Secondary | ICD-10-CM

## 2018-05-09 MED ORDER — GUAIFENESIN-CODEINE 100-10 MG/5ML PO SOLN: 5.0000 mL | Freq: Four times a day (QID) | ORAL | 0 refills | Status: DC | PRN

## 2018-05-09 NOTE — Progress Notes (Signed)
Subjective: CC: URi PCP: Mechele Claude, MD ZOX:WRUE D [name] is a 37 y.o. male presenting to clinic today for:  1. URI Patient reports onset of dry/minimally productive cough, sneezing, chills and subjective fever Saturday morning.  He notes it started with a headache and body aches and has seemingly progressed since that time.  He reports coughing so hard that he feels like he gags.  Denies any nausea, vomiting or diarrhea.  He has had several coworkers with similar symptoms.  He has been taking ibuprofen, Tylenol cold and sinus, Alka-Seltzer cold with little improvement in symptoms.  He notes that he was burning up last night and his wife insisted that he be evaluated today.   ROS: Per HPI  Allergies  Allergen Reactions  . Bee Venom Anaphylaxis  . Honey Anaphylaxis and Hives  . Penicillins Anaphylaxis   Past Medical History:  Diagnosis Date  . Back pain, chronic     Current Outpatient Medications:  .  DULoxetine (CYMBALTA) 60 MG capsule, Take 1 capsule (60 mg total) by mouth daily., Disp: 30 capsule, Rfl: 0 .  mirtazapine (REMERON) 15 MG tablet, Take 1/2 tablet daily, Disp: 15 tablet, Rfl: 2 .  triamcinolone cream (KENALOG) 0.1 %, Apply 1 application topically 3 (three) times daily., Disp: 45 g, Rfl: 0 .  gabapentin (NEURONTIN) 300 MG capsule, Take 3 capsules (900 mg total) by mouth 2 (two) times daily., Disp: 180 capsule, Rfl: 1  Social History   Socioeconomic History  . Marital status: Married    Spouse name: Not on file  . Number of children: Not on file  . Years of education: Not on file  . Highest education level: Not on file  Occupational History  . Not on file  Social Needs  . Financial resource strain: Not on file  . Food insecurity:    Worry: Not on file    Inability: Not on file  . Transportation needs:    Medical: Not on file    Non-medical: Not on file  Tobacco Use  . Smoking status: Never Smoker  . Smokeless tobacco: Never Used  Substance and Sexual  Activity  . Alcohol use: Yes    Comment: occasionally  . Drug use: No  . Sexual activity: Not on file  Lifestyle  . Physical activity:    Days per week: Not on file    Minutes per session: Not on file  . Stress: Not on file  Relationships  . Social connections:    Talks on phone: Not on file    Gets together: Not on file    Attends religious service: Not on file    Active member of club or organization: Not on file    Attends meetings of clubs or organizations: Not on file    Relationship status: Not on file  . Intimate partner violence:    Fear of current or ex partner: Not on file    Emotionally abused: Not on file    Physically abused: Not on file    Forced sexual activity: Not on file  Other Topics Concern  . Not on file  Social History Narrative  . Not on file   History reviewed. No pertinent family history.  Objective: Office vital signs reviewed. BP 118/79   Pulse 94   Temp (!) 97.3 F (36.3 C) (Oral)   Ht 5\' 10"  (1.778 m)   Wt 285 lb (129.3 kg)   SpO2 98%   BMI 40.89 kg/m   Physical Examination:  General: Awake, alert, well nourished, nontoxic. No acute distress HEENT: Normal    Neck: No masses palpated. No lymphadenopathy    Ears: Tympanic membranes intact, normal light reflex, no erythema, no bulging    Eyes: PERRLA, extraocular membranes intact, sclera white    Nose: nasal turbinates moist, clear nasal discharge    Throat: moist mucus membranes, no erythema, grade 2 tonsils with no tonsillar exudate.  Airway is patent Cardio: regular rate and rhythm, S1S2 heard, no murmurs appreciated Pulm: clear to auscultation bilaterally, no wheezes, rhonchi or rales; normal work of breathing on room air  Assessment/ Plan: 37 y.o. male   1. Viral URI with cough Patient is afebrile and nontoxic-appearing with normal vital signs including oxygen saturation on room air.  His physical exam is unremarkable except for mild enlargement of his tonsils.  I suspect that  this is a viral URI and recommended continuing supportive care with OTC therapies.  I have added Robitussin-AC to use every 6 hours if needed for cough.  Caution sedation.  Work note provided excusing through Thursday with return on Friday if he is feeling better.  If symptoms persist, I did ask that he call the office on Friday at which time I will send in an antibiotic.  Likely Z-Pak since he has penicillin allergy.  Patient understands reasons for return should symptoms worsen and reasons for urgent/emergent evaluation.  Follow-up PRN.  Meds ordered this encounter  Medications  . guaiFENesin-codeine 100-10 MG/5ML syrup    Sig: Take 5 mLs by mouth every 6 (six) hours as needed for cough.    Dispense:  120 mL    Refill:  0   The Narcotic Database has been reviewed.  There were no red flags.     Raliegh IpAshly M Macaila Tahir, DO [facility] [phone]

## 2018-05-09 NOTE — Patient Instructions (Signed)
At this time, it does not appear that you have a bacterial infection.  I suspect that this is a viral infection.  I have prescribed you cough medication containing codeine and a decongestant.  You may use this every 6 hours if needed.  Be aware that the codeine may cause sedation so do not operate any heavy machinery or drive while taking the medicine.  I recommend that you remain out of work for at least the next 2 days while you rest and hydrate.  If symptoms acutely worsen, you develop any other worsening symptoms or signs that we discussed or symptoms are not improving by Friday, please contact our office.  It appears that you have a viral upper respiratory infection (cold).  Cold symptoms can last up to 2 weeks.    - Get plenty of rest and drink plenty of fluids. - Try to breathe moist air. Use a cold mist humidifier. - Consume warm fluids (soup or tea) to provide relief for a stuffy nose and to loosen phlegm. - For nasal stuffiness, try saline nasal spray or a Neti Pot. Afrin nasal spray can also be used but this product should not be used longer than 3 days or it will cause rebound nasal stuffiness (worsening nasal congestion). - For sore throat pain relief: use chloraseptic spray, suck on throat lozenges, hard candy or popsicles; gargle with warm salt water (1/4 tsp. salt per 8 oz. of water); and eat soft, bland foods. - Eat a well-balanced diet. If you cannot, ensure you are getting enough nutrients by taking a daily multivitamin. - Avoid dairy products, as they can thicken phlegm. - Avoid alcohol, as it impairs your body's immune system.  CONTACT YOUR DOCTOR IF YOU EXPERIENCE ANY OF THE FOLLOWING: - High fever - Ear pain - Sinus-type headache - Unusually severe cold symptoms - Cough that gets worse while other cold symptoms improve - Flare up of any chronic lung problem, such as asthma - Your symptoms persist longer than 2 weeks

## 2018-05-27 ENCOUNTER — Other Ambulatory Visit: Payer: Self-pay | Admitting: Family Medicine

## 2018-05-29 MED ORDER — GABAPENTIN 300 MG PO CAPS: 900.0000 mg | ORAL_CAPSULE | Freq: Two times a day (BID) | ORAL | 1 refills | Status: DC

## 2018-05-29 MED ORDER — GUAIFENESIN-CODEINE 100-10 MG/5ML PO SOLN: 5.0000 mL | Freq: Four times a day (QID) | ORAL | 0 refills | Status: DC | PRN

## 2018-06-01 ENCOUNTER — Other Ambulatory Visit: Payer: Self-pay | Admitting: Family Medicine

## 2018-06-06 ENCOUNTER — Other Ambulatory Visit: Payer: Self-pay | Admitting: Family Medicine

## 2018-07-16 ENCOUNTER — Other Ambulatory Visit: Payer: Self-pay | Admitting: Family Medicine

## 2018-07-21 ENCOUNTER — Ambulatory Visit (INDEPENDENT_AMBULATORY_CARE_PROVIDER_SITE_OTHER): Payer: BLUE CROSS/BLUE SHIELD | Admitting: Family Medicine

## 2018-07-21 ENCOUNTER — Encounter: Payer: Self-pay | Admitting: Family Medicine

## 2018-07-21 VITALS — BP 126/85 | HR 75 | Temp 97.8°F | Ht 70.0 in | Wt 278.0 lb

## 2018-07-21 DIAGNOSIS — K2100 Gastro-esophageal reflux disease with esophagitis, without bleeding: Secondary | ICD-10-CM

## 2018-07-21 DIAGNOSIS — Z23 Encounter for immunization: Secondary | ICD-10-CM

## 2018-07-21 DIAGNOSIS — F324 Major depressive disorder, single episode, in partial remission: Secondary | ICD-10-CM

## 2018-07-21 DIAGNOSIS — M5416 Radiculopathy, lumbar region: Secondary | ICD-10-CM

## 2018-07-21 MED ORDER — DULOXETINE HCL 60 MG PO CPEP: 60.0000 mg | ORAL_CAPSULE | Freq: Every day | ORAL | 1 refills | Status: DC

## 2018-07-21 MED ORDER — GABAPENTIN 300 MG PO CAPS: 900.0000 mg | ORAL_CAPSULE | Freq: Two times a day (BID) | ORAL | 1 refills | Status: DC

## 2018-07-21 MED ORDER — PANTOPRAZOLE SODIUM 40 MG PO TBEC: 40.0000 mg | DELAYED_RELEASE_TABLET | Freq: Every day | ORAL | 11 refills | Status: AC

## 2018-07-21 NOTE — Progress Notes (Addendum)
Subjective:  Patient ID: [name], male    DOB: 11-Dec-1980  Age: 37 y.o. MRN: [id number]  CC: Follow-up   HPI [name] presents for recheck of pain and depression. Now in remission while taking cymbalta. Needs refill of gabapentin. Working well for pain relief. Neither is causing any side effects.  Having substernal heartburn when taking a deep breath after eating spicy foods such as pepperoni. Occurring more frequently since onset several months ago. Pain is moderate intensity substernal burning  Depression screen [facility] 2/9 07/21/2018 04/18/2018 03/22/2018  Decreased Interest 1 1 1   Down, Depressed, Hopeless 1 1 1   PHQ - 2 Score 2 2 2   Altered sleeping 1 0 1  Tired, decreased energy 2 1 1   Change in appetite 1 1 0  Feeling bad or failure about yourself  1 1 1   Trouble concentrating 2 0 0  Moving slowly or fidgety/restless 0 0 0  Suicidal thoughts 0 0 0  PHQ-9 Score 9 5 5   Difficult doing work/chores - - Somewhat difficult    History [name] has a past medical history of Back pain, chronic.   [name] has a past surgical history that includes Back surgery.   His family history is not on file.[name] reports that [name] has never smoked. [name] has never used smokeless tobacco. [name] reports that [name] drinks alcohol. [name] reports that [name] does not use drugs.    ROS Review of Systems  Constitutional: Negative for fever.  Respiratory: Negative for shortness of breath.   Cardiovascular: Negative for chest pain.  Musculoskeletal: Negative for arthralgias.  Skin: Negative for rash.    Objective:  BP 126/85   Pulse 75   Temp 97.8 F (36.6 C)   Ht 5\' 10"  (1.778 m)   Wt 278 lb (126.1 kg)   BMI 39.89 kg/m   BP Readings from Last 3 Encounters:  07/21/18 126/85  05/09/18 118/79  04/20/18 119/84    Wt Readings from Last 3 Encounters:  07/21/18 278 lb (126.1 kg)  05/09/18 285 lb (129.3 kg)  04/20/18 280 lb 8 oz (127.2 kg)     Physical Exam  Constitutional: [name] is oriented to person, place,  and time. [name] appears well-developed and well-nourished.  HENT:  Head: Normocephalic and atraumatic.  Right Ear: External ear normal.  Left Ear: External ear normal.  Mouth/Throat: No oropharyngeal exudate or posterior oropharyngeal erythema.  Eyes: Pupils are equal, round, and reactive to light.  Neck: Normal range of motion. Neck supple.  Cardiovascular: Normal rate and regular rhythm.  No murmur heard. Pulmonary/Chest: Breath sounds normal. No respiratory distress.  Neurological: [name] is alert and oriented to person, place, and time.  Vitals reviewed.     Assessment & Plan:   Koleson was seen today for follow-up.  Diagnoses and all orders for this visit:  Lumbar radiculopathy  Need for immunization against influenza -     Flu Vaccine QUAD 36+ mos IM  Major depressive disorder with single episode, in partial remission (HCC)  Gastroesophageal reflux disease with esophagitis  Other orders -     gabapentin (NEURONTIN) 300 MG capsule; Take 3 capsules (900 mg total) by mouth 2 (two) times daily. -     DULoxetine (CYMBALTA) 60 MG capsule; Take 1 capsule (60 mg total) by mouth daily. -     pantoprazole (PROTONIX) 40 MG tablet; Take 1 tablet (40 mg total) by mouth daily. For stomach       I have discontinued [name]'s guaiFENesin-codeine.  I have also changed his DULoxetine. Additionally, I am having him start on pantoprazole. Lastly, I am having him maintain his triamcinolone cream, mirtazapine, and gabapentin.  Allergies as of 07/21/2018      Reactions   Bee Venom Anaphylaxis   Honey Anaphylaxis, Hives   Penicillins Anaphylaxis      Medication List        Accurate as of 07/21/18 10:38 PM. Always use your most recent med list.          DULoxetine 60 MG capsule Commonly known as:  CYMBALTA Take 1 capsule (60 mg total) by mouth daily.   gabapentin 300 MG capsule Commonly known as:  NEURONTIN Take 3 capsules (900 mg total) by mouth 2 (two) times daily.     mirtazapine 15 MG tablet Commonly known as:  REMERON TAKE 1 TABLET BY MOUTH AT BEDTIME   pantoprazole 40 MG tablet Commonly known as:  PROTONIX Take 1 tablet (40 mg total) by mouth daily. For stomach   triamcinolone cream 0.1 % Commonly known as:  KENALOG Apply 1 application topically 3 (three) times daily.        Follow-up: Return in about 6 months (around 01/19/2019).  Mechele Claude, M.D.

## 2018-07-21 NOTE — Patient Instructions (Signed)
Please follow up with Dr. Darlyn Read in 6 months for a physical.

## 2018-08-17 ENCOUNTER — Emergency Department (HOSPITAL_COMMUNITY): Payer: BLUE CROSS/BLUE SHIELD

## 2018-08-17 ENCOUNTER — Other Ambulatory Visit: Payer: Self-pay

## 2018-08-17 ENCOUNTER — Emergency Department (HOSPITAL_COMMUNITY)
Admission: EM | Admit: 2018-08-17 | Discharge: 2018-08-17 | Disposition: A | Discharge status: Home / Self Care | Payer: BLUE CROSS/BLUE SHIELD | Attending: Emergency Medicine | Admitting: Emergency Medicine

## 2018-08-17 ENCOUNTER — Encounter (HOSPITAL_COMMUNITY): Payer: Self-pay | Admitting: Emergency Medicine

## 2018-08-17 DIAGNOSIS — Z79899 Other long term (current) drug therapy: Secondary | ICD-10-CM | POA: Diagnosis not present

## 2018-08-17 DIAGNOSIS — R0789 Other chest pain: Secondary | ICD-10-CM | POA: Diagnosis not present

## 2018-08-17 DIAGNOSIS — R0781 Pleurodynia: Secondary | ICD-10-CM | POA: Diagnosis not present

## 2018-08-17 DIAGNOSIS — R0602 Shortness of breath: Secondary | ICD-10-CM | POA: Diagnosis not present

## 2018-08-17 HISTORY — DX: Diverticulosis of intestine, part unspecified, without perforation or abscess without bleeding: K57.90

## 2018-08-17 MED ORDER — NAPROXEN 500 MG PO TABS: 500.0000 mg | ORAL_TABLET | Freq: Two times a day (BID) | ORAL | 0 refills | Status: DC

## 2018-08-17 NOTE — Discharge Instructions (Addendum)
Your xrays are negative with no lung infection and with no evidence for a rib fracture.  I suspect you may have strained the cartilage in your ribcage which can be painful but should heal within the next week with care.  Use the medicine prescribed along with a heating pad applied  20 minutes several times daily.

## 2018-08-17 NOTE — ED Triage Notes (Signed)
Patient states he started coughing and sneezing last night, c/o pain to his R ribs.

## 2018-08-18 NOTE — ED Provider Notes (Signed)
[facility] EMERGENCY DEPARTMENT Provider Note   CSN: [id number] Arrival date & time: 08/17/18  1317     History   Chief Complaint Chief Complaint  Patient presents with  . Chest Pain    Rib pain after coughing    HPI [name] is a 37 y.o. male presenting with a several week history of chronic nonproductive without wheezing, shortness of breath, fever or chest pain.  He does have allergies and has seen his pcp for cough frequency and was placed on an antihistamine.  He has not noticed improvement in the cough.  However, last night in addition to a cough spell, he also sneezed and developed severe right sided anterior lower rib cage pain along with a popping sensation initially. He still denies sob, but endorses constant pain worsened with deep inspiration, but more so with twisting his torso and palpating the site of pain.  He has had no swelling at the site. He has had no tx prior to arrival.  The history is provided by the patient.    Past Medical History:  Diagnosis Date  . Back pain, chronic   . Diverticulosis     There are no active problems to display for this patient.   Past Surgical History:  Procedure Laterality Date  . BACK SURGERY          Home Medications    Prior to Admission medications   Medication Sig Start Date End Date Taking? Authorizing Provider  DULoxetine (CYMBALTA) 60 MG capsule Take 1 capsule (60 mg total) by mouth daily. 07/21/18   Mechele ClaudeStacks, Warren, MD  gabapentin (NEURONTIN) 300 MG capsule Take 3 capsules (900 mg total) by mouth 2 (two) times daily. 07/21/18 08/20/18  Mechele ClaudeStacks, Warren, MD  mirtazapine (REMERON) 15 MG tablet TAKE 1 TABLET BY MOUTH AT BEDTIME 07/17/18   Mechele ClaudeStacks, Warren, MD  naproxen (NAPROSYN) 500 MG tablet Take 1 tablet (500 mg total) by mouth 2 (two) times daily. 08/17/18   Burgess AmorIdol, Razi Hickle, PA-C  pantoprazole (PROTONIX) 40 MG tablet Take 1 tablet (40 mg total) by mouth daily. For stomach 07/21/18   Mechele ClaudeStacks, Warren, MD  triamcinolone  cream (KENALOG) 0.1 % Apply 1 application topically 3 (three) times daily. 02/17/18   Mechele ClaudeStacks, Warren, MD    Family History Family History  Problem Relation Age of Onset  . Diabetes Other     Social History Social History   Tobacco Use  . Smoking status: Never Smoker  . Smokeless tobacco: Never Used  Substance Use Topics  . Alcohol use: Yes    Comment: occasionally  . Drug use: No     Allergies   Bee venom; Honey; and Penicillins   Review of Systems Review of Systems  Constitutional: Negative for chills and fever.  HENT: Positive for rhinorrhea. Negative for congestion and sore throat.   Eyes: Negative.   Respiratory: Positive for cough. Negative for chest tightness and shortness of breath.   Cardiovascular: Positive for chest pain.  Gastrointestinal: Negative for abdominal pain, nausea and vomiting.  Genitourinary: Negative.   Musculoskeletal: Negative for arthralgias, joint swelling and neck pain.  Skin: Negative.  Negative for rash and wound.  Neurological: Negative for dizziness, weakness, light-headedness, numbness and headaches.  Psychiatric/Behavioral: Negative.      Physical Exam Updated Vital Signs BP 126/88 (BP Location: Right Arm)   Pulse 70   Temp 97.9 F (36.6 C) (Oral)   Resp 17   Ht 5\' 9"  (1.753 m)   Wt 120.2 kg  SpO2 99%   BMI 39.13 kg/m   Physical Exam  Constitutional: He appears well-developed and well-nourished.  HENT:  Head: Normocephalic and atraumatic.  Eyes: Conjunctivae are normal.  Neck: Normal range of motion.  Cardiovascular: Normal rate, regular rhythm, normal heart sounds and intact distal pulses.  Pulmonary/Chest: Effort normal and breath sounds normal. No accessory muscle usage. No tachypnea. No respiratory distress. He has no decreased breath sounds. He has no wheezes. He has no rhonchi. He has no rales.  Pt is point ttp right lower ribcage without crepitus or deformity.     Abdominal: Soft. Bowel sounds are normal. There  is no tenderness.  Musculoskeletal: Normal range of motion.  Neurological: He is alert.  Skin: Skin is warm and dry.  Psychiatric: He has a normal mood and affect.  Nursing note and vitals reviewed.    ED Treatments / Results  Labs (all labs ordered are listed, but only abnormal results are displayed) Labs Reviewed - No data to display  EKG None  Radiology Dg Ribs Unilateral W/chest Right  Result Date: 08/17/2018 CLINICAL DATA:  Sudden onset of right anterior chest and upper abdomen pain last night EXAM: RIGHT RIBS AND CHEST - 3+ VIEW COMPARISON:  Chest x-ray of 08/31/2016 FINDINGS: No active infiltrate or effusion is seen. Mediastinal and hilar contours are unremarkable. The heart is within normal limits in size. Right rib detail films show no evidence of acute right rib fracture with the area of pain marked with a small metallic marker. IMPRESSION: 1. No active lung disease. 2. Negative right rib detail. Electronically Signed   By: Dwyane Dee M.D.   On: 08/17/2018 16:12    Procedures Procedures (including critical care time)  Medications Ordered in ED Medications - No data to display   Initial Impression / Assessment and Plan / ED Course  I have reviewed the triage vital signs and the nursing notes.  Pertinent labs & imaging results that were available during my care of the patient were reviewed by me and considered in my medical decision making (see chart for details).     Imaging reviewed and discussed with pt. Suspect ribcage strain, no fx per imaging today. No sob, VSS, doubt PE. No pneumonia or pneumothorax.  Heat tx, nsaids, f/u pcp 1 week if not improved.  Return precautions outlined  Final Clinical Impressions(s) / ED Diagnoses   Final diagnoses:  Chest wall pain    ED Discharge Orders         Ordered    naproxen (NAPROSYN) 500 MG tablet  2 times daily     08/17/18 1619           Burgess Amor, PA-C 08/18/18 1259    Bethann Berkshire, MD 08/19/18  1027

## 2018-08-21 ENCOUNTER — Encounter: Payer: Self-pay | Admitting: Physician Assistant

## 2018-08-21 ENCOUNTER — Ambulatory Visit (INDEPENDENT_AMBULATORY_CARE_PROVIDER_SITE_OTHER): Payer: BLUE CROSS/BLUE SHIELD | Admitting: Physician Assistant

## 2018-08-21 VITALS — BP 126/89 | HR 97 | Temp 98.3°F | Ht 69.0 in | Wt 292.4 lb

## 2018-08-21 DIAGNOSIS — J309 Allergic rhinitis, unspecified: Secondary | ICD-10-CM | POA: Diagnosis not present

## 2018-08-21 DIAGNOSIS — J0111 Acute recurrent frontal sinusitis: Secondary | ICD-10-CM

## 2018-08-21 DIAGNOSIS — A530 Latent syphilis, unspecified as early or late: Secondary | ICD-10-CM | POA: Insufficient documentation

## 2018-08-21 MED ORDER — DOXYCYCLINE HYCLATE 100 MG PO TABS: 100.0000 mg | ORAL_TABLET | Freq: Two times a day (BID) | ORAL | 0 refills | Status: DC

## 2018-08-21 MED ORDER — HYDROCODONE-HOMATROPINE 5-1.5 MG/5ML PO SYRP: 5.0000 mL | ORAL_SOLUTION | Freq: Four times a day (QID) | ORAL | 0 refills | Status: DC | PRN

## 2018-08-21 MED ORDER — PREDNISONE 10 MG (21) PO TBPK: ORAL_TABLET | ORAL | 0 refills | Status: DC

## 2018-08-21 NOTE — Progress Notes (Signed)
BP 126/89   Pulse 97   Temp 98.3 F (36.8 C) (Oral)   Ht 5\' 9"  (1.753 m)   Wt 292 lb 6.4 oz (132.6 kg)   BMI 43.18 kg/m    Subjective:    Patient ID: [name], [name]    DOB: 1981-06-30, 37 y.o.   MRN: [id number]  HPI: [name] is a 37 y.o. [name] presenting on 08/21/2018 for Sinusitis  This patient has had many days of sinus headache and postnasal drainage. There is copious drainage at times. Denies any fever at this time. There has been a history of sinus infections in the past.  No history of sinus surgery. There is cough at night. It has become more prevalent in recent days. He has frequent infections like his sister. She recently had repair of her sinuses and is much better.  Past Medical History:  Diagnosis Date  . Back pain, chronic   . Diverticulosis    Relevant past medical, surgical, family and social history reviewed and updated as indicated. Interim medical history since our last visit reviewed. Allergies and medications reviewed and updated. DATA REVIEWED: CHART IN EPIC  Family History reviewed for pertinent findings.  Review of Systems  Constitutional: Positive for fatigue. Negative for appetite change and fever.  HENT: Positive for congestion, sinus pressure, sinus pain and sore throat.   Eyes: Negative.  Negative for pain and visual disturbance.  Respiratory: Positive for cough. Negative for chest tightness, shortness of breath and wheezing.   Cardiovascular: Negative.  Negative for chest pain, palpitations and leg swelling.  Gastrointestinal: Negative.  Negative for abdominal pain, diarrhea, nausea and vomiting.  Endocrine: Negative.   Genitourinary: Negative.   Musculoskeletal: Positive for back pain and myalgias.  Skin: Negative.  Negative for color change and rash.  Neurological: Positive for headaches. Negative for weakness and numbness.  Psychiatric/Behavioral: Negative.     Allergies as of 08/21/2018      Reactions   Bee Venom Anaphylaxis   Honey Anaphylaxis, Hives   Penicillins Anaphylaxis      Medication List        Accurate as of 08/21/18  3:05 PM. Always use your most recent med list.          doxycycline 100 MG tablet Commonly known as:  VIBRA-TABS Take 1 tablet (100 mg total) by mouth 2 (two) times daily. 1 po bid   DULoxetine 60 MG capsule Commonly known as:  CYMBALTA Take 1 capsule (60 mg total) by mouth daily.   gabapentin 300 MG capsule Commonly known as:  NEURONTIN Take 3 capsules (900 mg total) by mouth 2 (two) times daily.   HYDROcodone-homatropine 5-1.5 MG/5ML syrup Commonly known as:  HYCODAN Take 5-10 mLs by mouth every 6 (six) hours as needed.   mirtazapine 15 MG tablet Commonly known as:  REMERON TAKE 1 TABLET BY MOUTH AT BEDTIME   naproxen 500 MG tablet Commonly known as:  NAPROSYN Take 1 tablet (500 mg total) by mouth 2 (two) times daily.   pantoprazole 40 MG tablet Commonly known as:  PROTONIX Take 1 tablet (40 mg total) by mouth daily. For stomach   predniSONE 10 MG (21) Tbpk tablet Commonly known as:  STERAPRED UNI-PAK 21 TAB As directed x 6 days   triamcinolone cream 0.1 % Commonly known as:  KENALOG Apply 1 application topically 3 (three) times daily.          Objective:    BP 126/89   Pulse 97  Temp 98.3 F (36.8 C) (Oral)   Ht 5\' 9"  (1.753 m)   Wt 292 lb 6.4 oz (132.6 kg)   BMI 43.18 kg/m   Allergies  Allergen Reactions  . Bee Venom Anaphylaxis  . Honey Anaphylaxis and Hives  . Penicillins Anaphylaxis    Wt Readings from Last 3 Encounters:  08/21/18 292 lb 6.4 oz (132.6 kg)  08/17/18 265 lb (120.2 kg)  07/21/18 278 lb (126.1 kg)    Physical Exam  Constitutional: He is oriented to person, place, and time. He appears well-developed and well-nourished.  HENT:  Head: Normocephalic and atraumatic.  Right Ear: Tympanic membrane and external ear normal. No middle ear effusion.  Left Ear: Tympanic membrane and external ear normal.  No middle ear effusion.    Nose: Mucosal edema and rhinorrhea present. Right sinus exhibits no maxillary sinus tenderness. Left sinus exhibits no maxillary sinus tenderness.  Mouth/Throat: Uvula is midline. Posterior oropharyngeal erythema present.  Eyes: Pupils are equal, round, and reactive to light. Conjunctivae and EOM are normal. Right eye exhibits no discharge. Left eye exhibits no discharge.  Neck: Normal range of motion.  Cardiovascular: Normal rate, regular rhythm and normal heart sounds.  Pulmonary/Chest: Effort normal and breath sounds normal. No respiratory distress. He has no wheezes.  Abdominal: Soft.  Lymphadenopathy:    He has no cervical adenopathy.  Neurological: He is alert and oriented to person, place, and time.  Skin: Skin is warm and dry.  Psychiatric: He has a normal mood and affect.        Assessment & Plan:   1. Acute recurrent frontal sinusitis - doxycycline (VIBRA-TABS) 100 MG tablet; Take 1 tablet (100 mg total) by mouth 2 (two) times daily. 1 po bid  Dispense: 20 tablet; Refill: 0 - predniSONE (STERAPRED UNI-PAK 21 TAB) 10 MG (21) TBPK tablet; As directed x 6 days  Dispense: 21 tablet; Refill: 0 - HYDROcodone-homatropine (HYCODAN) 5-1.5 MG/5ML syrup; Take 5-10 mLs by mouth every 6 (six) hours as needed.  Dispense: 240 mL; Refill: 0 - Ambulatory referral to ENT  2. Allergic rhinitis, unspecified seasonality, unspecified trigger - Ambulatory referral to ENT    Continue all other maintenance medications as listed above.  Follow up plan: No follow-ups on file.  Educational handout given for survey  Remus Loffler PA-C [facility] [address]  [place], [place] [address] [phone]   08/21/2018, 3:05 PM

## 2018-08-21 NOTE — Patient Instructions (Signed)
In a few days you may receive a survey in the mail or online from [employer] regarding your visit with us today. Please take a moment to fill this out. Your feedback is very important to our whole office. It can help us better understand your needs as well as improve your experience and satisfaction. Thank you for taking your time to complete it. We care about you.  Aamari West Deblasi, PA-C  

## 2018-09-19 ENCOUNTER — Telehealth: Payer: Self-pay | Admitting: Family Medicine

## 2018-09-19 DIAGNOSIS — J302 Other seasonal allergic rhinitis: Secondary | ICD-10-CM | POA: Diagnosis not present

## 2018-09-19 DIAGNOSIS — J343 Hypertrophy of nasal turbinates: Secondary | ICD-10-CM | POA: Diagnosis not present

## 2018-09-19 DIAGNOSIS — J324 Chronic pansinusitis: Secondary | ICD-10-CM | POA: Diagnosis not present

## 2018-09-19 DIAGNOSIS — J342 Deviated nasal septum: Secondary | ICD-10-CM | POA: Diagnosis not present

## 2018-09-19 NOTE — Telephone Encounter (Signed)
Pt states he missed work the dates of 12/30 through 1/3 due to his sinus issues and he saw ENT today and they gave him a note for today but would not extend it for last week, but since he was out for 5 days his work requires a short term disability form to be filled out. If he drops the form off will you fill it out and sign it or does he ntbs?

## 2018-09-19 NOTE — Telephone Encounter (Signed)
I can't do a note for a service I didn't provide. WS

## 2018-09-20 ENCOUNTER — Ambulatory Visit (INDEPENDENT_AMBULATORY_CARE_PROVIDER_SITE_OTHER): Payer: BLUE CROSS/BLUE SHIELD | Admitting: Family Medicine

## 2018-09-20 ENCOUNTER — Encounter: Payer: Self-pay | Admitting: Family Medicine

## 2018-09-20 VITALS — BP 131/89 | HR 101 | Temp 97.4°F | Ht 69.0 in | Wt 293.4 lb

## 2018-09-20 DIAGNOSIS — J320 Chronic maxillary sinusitis: Secondary | ICD-10-CM | POA: Diagnosis not present

## 2018-09-20 DIAGNOSIS — Z026 Encounter for examination for insurance purposes: Secondary | ICD-10-CM

## 2018-09-20 MED ORDER — PREDNISONE 20 MG PO TABS: ORAL_TABLET | ORAL | 0 refills | Status: DC

## 2018-09-20 NOTE — Telephone Encounter (Signed)
Left message saying,  Dr. Darlyn Read will not be able to give a work note.  He did not provide the services to cover the excuse.

## 2018-09-20 NOTE — Progress Notes (Signed)
BP 131/89   Pulse (!) 101   Temp (!) 97.4 F (36.3 C) (Oral)   Ht 5\' 9"  (1.753 m)   Wt 293 lb 6.4 oz (133.1 kg)   BMI 43.33 kg/m    Subjective:    Patient ID: [name], male    DOB: [birth year], 38 y.o.   MRN: [id number]  HPI: [name] is a 38 y.o. male presenting on 09/20/2018 for Sinusitis (needs note to return to work)   HPI Sinus congestion and pressure and headache Patient is coming in with sinus congestion pressure and headache.  This is something that he has been struggling with since August of last year.  He did see ENT just a couple days ago and they did start Levaquin yesterday.  He says he is feeling worse today and just not improved.  He does have follow-up with ENT after he finishes 2 weeks of the Levaquin.  He is also using Flonase currently.  He denies any fevers or chills but mainly just a lot of pressure in his face and his head especially on the right side.  He says is just been worsening even since he started the Levaquin yesterday.  Relevant past medical, surgical, family and social history reviewed and updated as indicated. Interim medical history since our last visit reviewed. Allergies and medications reviewed and updated.  Review of Systems  Constitutional: Negative for chills and fever.  HENT: Positive for congestion, postnasal drip, sinus pressure and sinus pain. Negative for ear discharge, ear pain, rhinorrhea, sneezing, sore throat and voice change.   Eyes: Negative for pain, discharge, redness and visual disturbance.  Respiratory: Positive for cough. Negative for shortness of breath and wheezing.   Cardiovascular: Negative for chest pain and leg swelling.  Musculoskeletal: Negative for gait problem.  Skin: Negative for rash.  All other systems reviewed and are negative.   Per HPI unless specifically indicated above   Allergies as of 09/20/2018      Reactions   Bee Venom Anaphylaxis   Honey Anaphylaxis, Hives   Penicillins Anaphylaxis        Medication List       Accurate as of September 20, 2018  5:52 PM. Always use your most recent med list.        doxycycline 100 MG tablet Commonly known as:  VIBRA-TABS Take 1 tablet (100 mg total) by mouth 2 (two) times daily. 1 po bid   DULoxetine 60 MG capsule Commonly known as:  CYMBALTA Take 1 capsule (60 mg total) by mouth daily.   fluticasone 50 MCG/ACT nasal spray Commonly known as:  FLONASE Place 2 sprays into the nose daily.   gabapentin 300 MG capsule Commonly known as:  NEURONTIN Take 3 capsules (900 mg total) by mouth 2 (two) times daily.   HYDROcodone-homatropine 5-1.5 MG/5ML syrup Commonly known as:  HYCODAN Take 5-10 mLs by mouth every 6 (six) hours as needed.   levofloxacin 500 MG tablet Commonly known as:  LEVAQUIN Take 1 tablet by mouth at bedtime.   mirtazapine 15 MG tablet Commonly known as:  REMERON TAKE 1 TABLET BY MOUTH AT BEDTIME   naproxen 500 MG tablet Commonly known as:  NAPROSYN Take 1 tablet (500 mg total) by mouth 2 (two) times daily.   pantoprazole 40 MG tablet Commonly known as:  PROTONIX Take 1 tablet (40 mg total) by mouth daily. For stomach   predniSONE 20 MG tablet Commonly known as:  DELTASONE Take 3 tabs daily for 1  week, then 2 tabs daily for week 2, then 1 tab daily for week 3.   triamcinolone cream 0.1 % Commonly known as:  KENALOG Apply 1 application topically 3 (three) times daily.          Objective:    BP 131/89   Pulse (!) 101   Temp (!) 97.4 F (36.3 C) (Oral)   Ht 5\' 9"  (1.753 m)   Wt 293 lb 6.4 oz (133.1 kg)   BMI 43.33 kg/m   Wt Readings from Last 3 Encounters:  09/20/18 293 lb 6.4 oz (133.1 kg)  08/21/18 292 lb 6.4 oz (132.6 kg)  08/17/18 265 lb (120.2 kg)    Physical Exam Vitals signs and nursing note reviewed.  Constitutional:      General: He is not in acute distress.    Appearance: He is well-developed. He is not diaphoretic.  HENT:     Right Ear: Tympanic membrane, ear canal and external  ear normal.     Left Ear: Tympanic membrane, ear canal and external ear normal.     Nose: Mucosal edema and rhinorrhea present.     Right Sinus: Maxillary sinus tenderness and frontal sinus tenderness present.     Left Sinus: Maxillary sinus tenderness present. No frontal sinus tenderness.     Mouth/Throat:     Pharynx: Uvula midline. Posterior oropharyngeal erythema present. No oropharyngeal exudate.     Tonsils: No tonsillar abscesses.  Eyes:     General: No scleral icterus.    Conjunctiva/sclera: Conjunctivae normal.  Neck:     Musculoskeletal: Neck supple.     Thyroid: No thyromegaly.  Cardiovascular:     Rate and Rhythm: Normal rate and regular rhythm.     Heart sounds: Normal heart sounds. No murmur.  Pulmonary:     Effort: Pulmonary effort is normal. No respiratory distress.     Breath sounds: Normal breath sounds. No wheezing or rales.  Musculoskeletal: Normal range of motion.  Lymphadenopathy:     Cervical: No cervical adenopathy.  Skin:    General: Skin is warm and dry.     Findings: No rash.  Neurological:     Mental Status: He is alert and oriented to person, place, and time.     Coordination: Coordination normal.  Psychiatric:        Behavior: Behavior normal.         Assessment & Plan:   Problem List Items Addressed This Visit    None    Visit Diagnoses    Chronic maxillary sinusitis    -  Primary   Patient has already seen ENT and is on Levaquin, will add prednisone   Relevant Medications   fluticasone (FLONASE) 50 MCG/ACT nasal spray   levofloxacin (LEVAQUIN) 500 MG tablet   predniSONE (DELTASONE) 20 MG tablet       Follow up plan: Return if symptoms worsen or fail to improve.  Counseling provided for all of the vaccine components No orders of the defined types were placed in this encounter.   Arville Care, MD [facility] 09/20/2018, 5:52 PM

## 2018-09-29 ENCOUNTER — Encounter: Payer: Self-pay | Admitting: Family Medicine

## 2018-09-29 ENCOUNTER — Ambulatory Visit (INDEPENDENT_AMBULATORY_CARE_PROVIDER_SITE_OTHER): Payer: BLUE CROSS/BLUE SHIELD | Admitting: Family Medicine

## 2018-09-29 VITALS — BP 124/88 | HR 88 | Temp 98.2°F | Ht 69.0 in | Wt 284.0 lb

## 2018-09-29 DIAGNOSIS — J0100 Acute maxillary sinusitis, unspecified: Secondary | ICD-10-CM | POA: Diagnosis not present

## 2018-09-29 DIAGNOSIS — M5416 Radiculopathy, lumbar region: Secondary | ICD-10-CM | POA: Diagnosis not present

## 2018-09-29 DIAGNOSIS — J320 Chronic maxillary sinusitis: Secondary | ICD-10-CM

## 2018-09-29 MED ORDER — DULOXETINE HCL 60 MG PO CPEP: 60.0000 mg | ORAL_CAPSULE | Freq: Every day | ORAL | 1 refills | Status: DC

## 2018-09-29 MED ORDER — GABAPENTIN 600 MG PO TABS: 1200.0000 mg | ORAL_TABLET | Freq: Two times a day (BID) | ORAL | 5 refills | Status: DC

## 2018-09-29 MED ORDER — LEVOFLOXACIN 500 MG PO TABS: 500.0000 mg | ORAL_TABLET | Freq: Every day | ORAL | 0 refills | Status: DC

## 2018-09-29 MED ORDER — MIRTAZAPINE 15 MG PO TABS: 15.0000 mg | ORAL_TABLET | Freq: Every day | ORAL | 1 refills | Status: DC

## 2018-09-29 MED ORDER — PSEUDOEPHEDRINE-GUAIFENESIN ER 120-1200 MG PO TB12: 1.0000 | ORAL_TABLET | Freq: Two times a day (BID) | ORAL | 0 refills | Status: AC

## 2018-09-29 MED ORDER — BETAMETHASONE SOD PHOS & ACET 6 (3-3) MG/ML IJ SUSP
6.0000 mg | Freq: Once | INTRAMUSCULAR | Status: AC
  Administered 2018-09-29: 6 mg via INTRAMUSCULAR

## 2018-09-29 NOTE — Addendum Note (Signed)
Addended by: Margurite Auerbach on: 09/29/2018 09:32 AM   Modules accepted: Orders

## 2018-09-29 NOTE — Progress Notes (Signed)
Subjective:  Patient ID: JONOVAN RASHED, male    DOB: 11-Aug-1981  Age: 38 y.o. MRN: [id number]  CC: Sinus Problem (pt here today c/o recurrent sinus issues and has been on multiple antibiotics and has seen ENT )   HPI Jaece D Ogando presents for Symptoms include congestion, right facial pain, nasal congestion with purulent rhinorrhea, non productive cough, post nasal drip and sinus pressure. There is no fever, chills, or sweats. Onset of symptoms wasseveral weks ago. ENT saw him, but sx not better after prednisone and levoquin  Depression screen [facility] 2/9 09/29/2018 08/21/2018 07/21/2018  Decreased Interest 1 1 1   Down, Depressed, Hopeless 1 1 1   PHQ - 2 Score 2 2 2   Altered sleeping 1 1 1   Tired, decreased energy 1 1 2   Change in appetite 0 1 1  Feeling bad or failure about yourself  1 1 1   Trouble concentrating 0 0 2  Moving slowly or fidgety/restless 0 0 0  Suicidal thoughts 0 0 0  PHQ-9 Score 5 6 9   Difficult doing work/chores - - -    History Jerel has a past medical history of Back pain, chronic and Diverticulosis.   He has a past surgical history that includes Back surgery.   His family history includes Diabetes in an other family member.He reports that he has never smoked. He has never used smokeless tobacco. He reports current alcohol use. He reports that he does not use drugs.    ROS Review of Systems  Constitutional: Negative for activity change, appetite change, chills, diaphoresis and fever.  HENT: Positive for congestion, postnasal drip, rhinorrhea and sinus pressure. Negative for ear discharge, ear pain, hearing loss, nosebleeds, sneezing, sore throat and trouble swallowing.   Respiratory: Negative for chest tightness and shortness of breath.   Cardiovascular: Negative for chest pain and palpitations.  Gastrointestinal: Negative for abdominal pain.  Musculoskeletal: Positive for arthralgias, back pain (no longer getting good relief for gabapenin), gait problem and  myalgias. Negative for neck pain.  Skin: Negative for rash.  Neurological: Positive for weakness. Negative for numbness.    Objective:  BP 124/88   Pulse 88   Temp 98.2 F (36.8 C) (Oral)   Ht 5\' 9"  (1.753 m)   Wt 284 lb (128.8 kg)   BMI 41.94 kg/m   BP Readings from Last 3 Encounters:  09/29/18 124/88  09/20/18 131/89  08/21/18 126/89    Wt Readings from Last 3 Encounters:  09/29/18 284 lb (128.8 kg)  09/20/18 293 lb 6.4 oz (133.1 kg)  08/21/18 292 lb 6.4 oz (132.6 kg)     Physical Exam    Assessment & Plan:   Orvid was seen today for sinus problem.  Diagnoses and all orders for this visit:  Lumbar radiculopathy  Chronic maxillary sinusitis -     betamethasone acetate-betamethasone sodium phosphate (CELESTONE) injection 6 mg  Acute maxillary sinusitis, recurrence not specified -     betamethasone acetate-betamethasone sodium phosphate (CELESTONE) injection 6 mg  Other orders -     gabapentin (NEURONTIN) 600 MG tablet; Take 2 tablets (1,200 mg total) by mouth 2 (two) times daily. -     levofloxacin (LEVAQUIN) 500 MG tablet; Take 1 tablet (500 mg total) by mouth daily. For 10 days -     Pseudoephedrine-Guaifenesin [phone] MG TB12; Take 1 tablet by mouth 2 (two) times daily. For congestion       I have discontinued Wissam D. Lattanzio's gabapentin, naproxen, doxycycline, HYDROcodone-homatropine, levofloxacin, and predniSONE.  I am also having him start on gabapentin, levofloxacin, and Pseudoephedrine-Guaifenesin. Additionally, I am having him maintain his triamcinolone cream, mirtazapine, DULoxetine, pantoprazole, and fluticasone. We will continue to administer betamethasone acetate-betamethasone sodium phosphate.  Allergies as of 09/29/2018      Reactions   Bee Venom Anaphylaxis   Honey Anaphylaxis, Hives   Penicillins Anaphylaxis      Medication List       Accurate as of September 29, 2018  9:20 AM. Always use your most recent med list.        DULoxetine  60 MG capsule Commonly known as:  CYMBALTA Take 1 capsule (60 mg total) by mouth daily.   fluticasone 50 MCG/ACT nasal spray Commonly known as:  FLONASE Place 2 sprays into the nose daily.   gabapentin 600 MG tablet Commonly known as:  NEURONTIN Take 2 tablets (1,200 mg total) by mouth 2 (two) times daily.   levofloxacin 500 MG tablet Commonly known as:  LEVAQUIN Take 1 tablet (500 mg total) by mouth daily. For 10 days   mirtazapine 15 MG tablet Commonly known as:  REMERON TAKE 1 TABLET BY MOUTH AT BEDTIME   pantoprazole 40 MG tablet Commonly known as:  PROTONIX Take 1 tablet (40 mg total) by mouth daily. For stomach   Pseudoephedrine-Guaifenesin [phone] MG Tb12 Take 1 tablet by mouth 2 (two) times daily. For congestion   triamcinolone cream 0.1 % Commonly known as:  KENALOG Apply 1 application topically 3 (three) times daily.        Follow-up: No follow-ups on file.  Mechele ClaudeWarren Zaccheus Edmister, M.D.

## 2018-10-02 ENCOUNTER — Encounter: Payer: Self-pay | Admitting: Family Medicine

## 2018-10-20 DIAGNOSIS — J343 Hypertrophy of nasal turbinates: Secondary | ICD-10-CM | POA: Diagnosis not present

## 2018-10-20 DIAGNOSIS — J324 Chronic pansinusitis: Secondary | ICD-10-CM | POA: Diagnosis not present

## 2018-10-20 DIAGNOSIS — J342 Deviated nasal septum: Secondary | ICD-10-CM | POA: Diagnosis not present

## 2018-10-20 DIAGNOSIS — J329 Chronic sinusitis, unspecified: Secondary | ICD-10-CM | POA: Diagnosis not present

## 2018-10-24 IMAGING — CT CT ABD-PELV W/ CM
2 of 4 series · 15 of 46 positions shown, 17 images · IV contrast (Isovue)
Comparison: Abdominal series 04/18/2018. CT abdomen and pelvis
04/24/2008.

CLINICAL DATA: 36-year-old male with right lower quadrant pain,
nausea and diarrhea for the past 2 days. Subsequent encounter.

EXAM:
CT ABDOMEN AND PELVIS WITH CONTRAST
TECHNIQUE: Multidetector CT imaging of the abdomen and pelvis was performed
using the standard protocol following bolus administration of
intravenous contrast.
CONTRAST:  100mL [id number] IOPAMIDOL ([id number]) INJECTION 61%

[Series 2: axial st · axial · 0.84mm/px · z∈[-822,-352]mm · 12 of 104 slices shown, 14 images]
[im 5/104  soft-tissue]
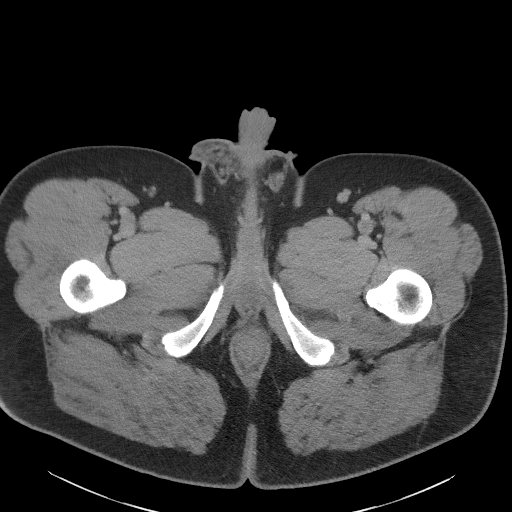
[im 5/104  bone]
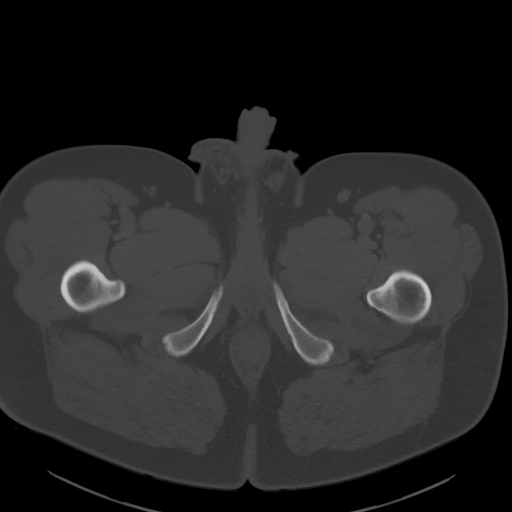
[im 13/104  soft-tissue]
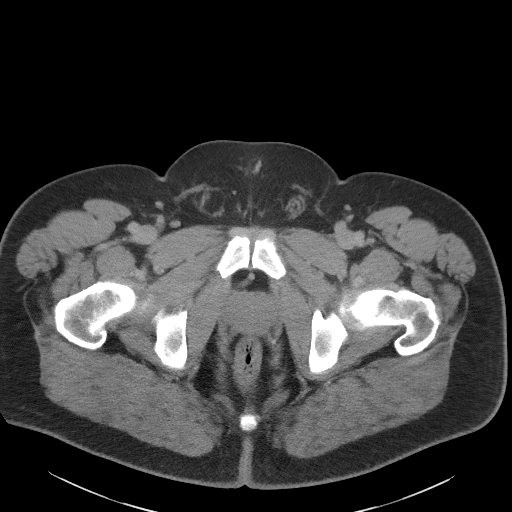
[im 22/104  soft-tissue]
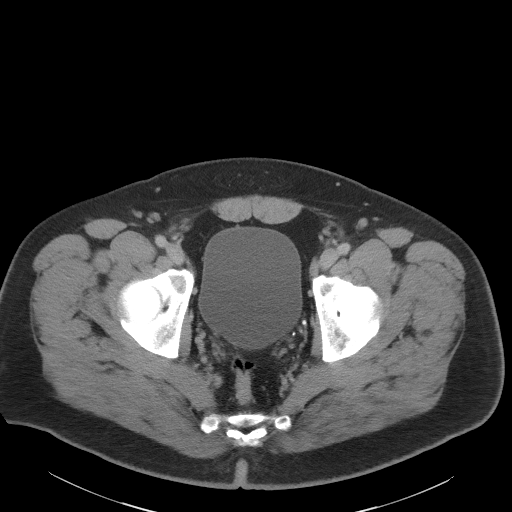
[im 31/104  soft-tissue]
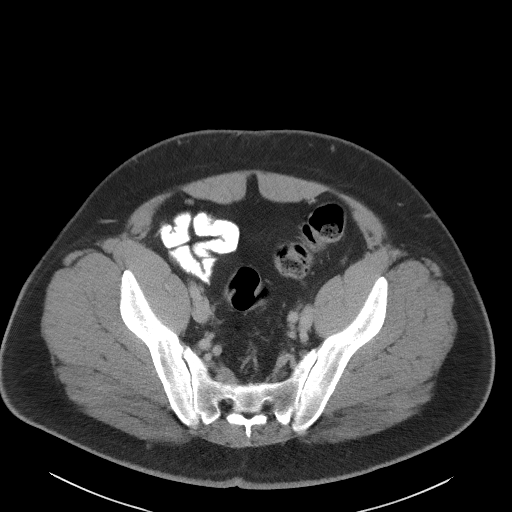
[im 39/104  soft-tissue]
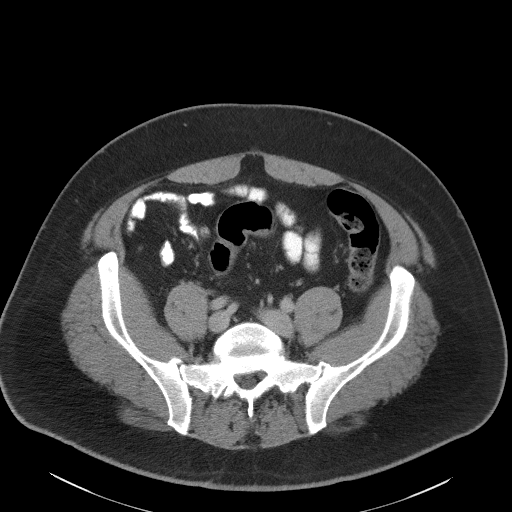
[im 48/104  soft-tissue]
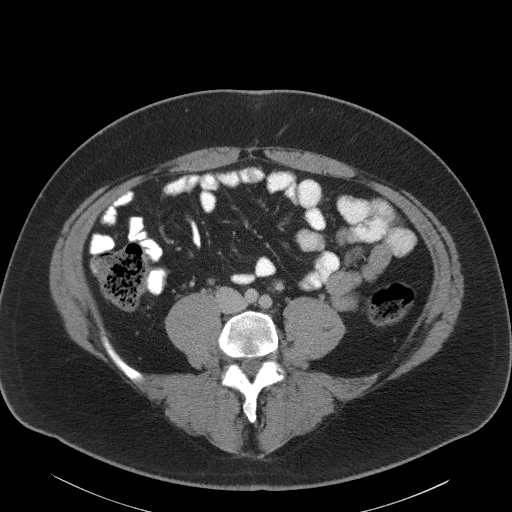
[im 56/104  soft-tissue]
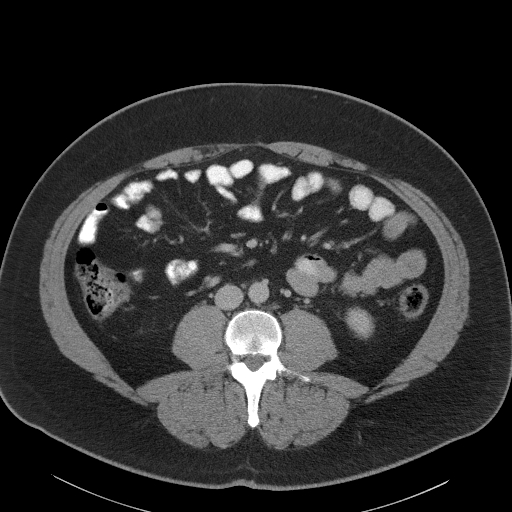
[im 65/104  soft-tissue]
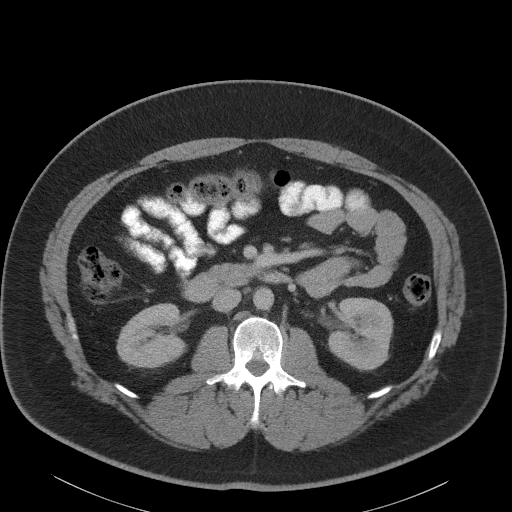
[im 73/104  soft-tissue]
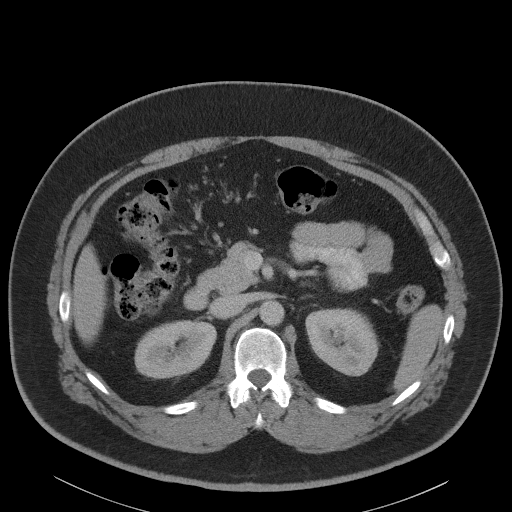
[im 73/104  bone]
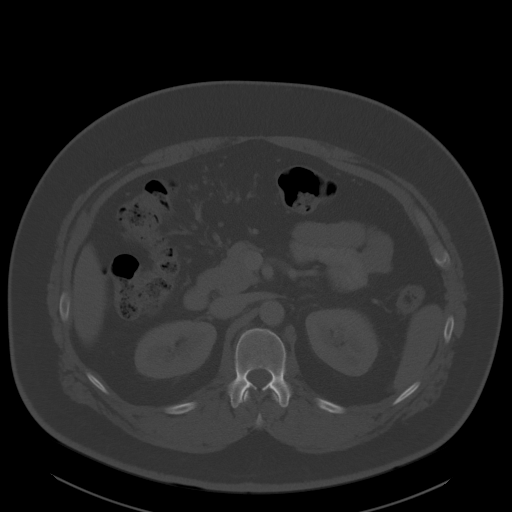
[im 82/104  soft-tissue]
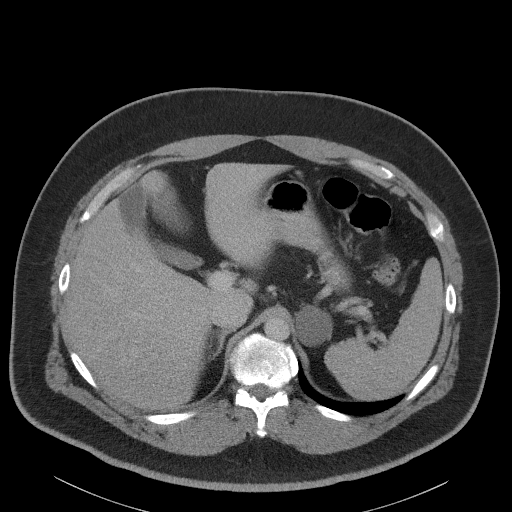
[im 91/104  soft-tissue]
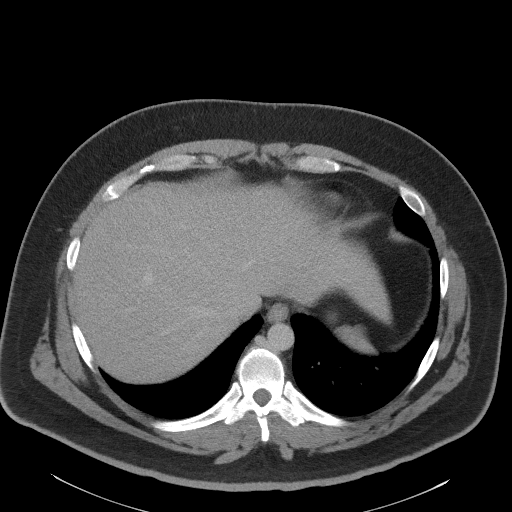
[im 99/104  soft-tissue]
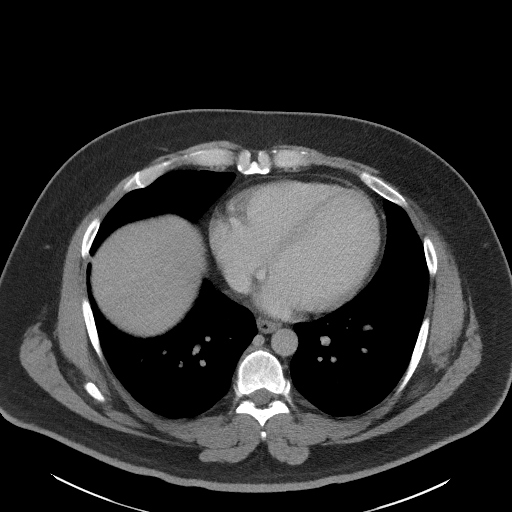

[Series 5: coronal st · coronal · 0.85mm/px · 3 of 117 slices shown]
[im 39/117  soft-tissue]
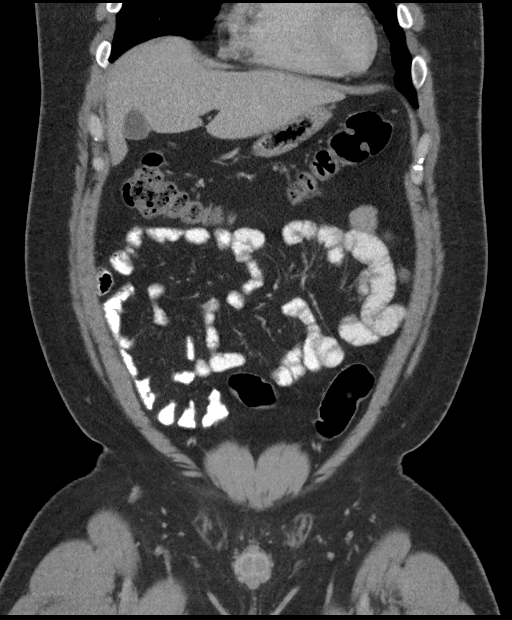
[im 52/117  soft-tissue]
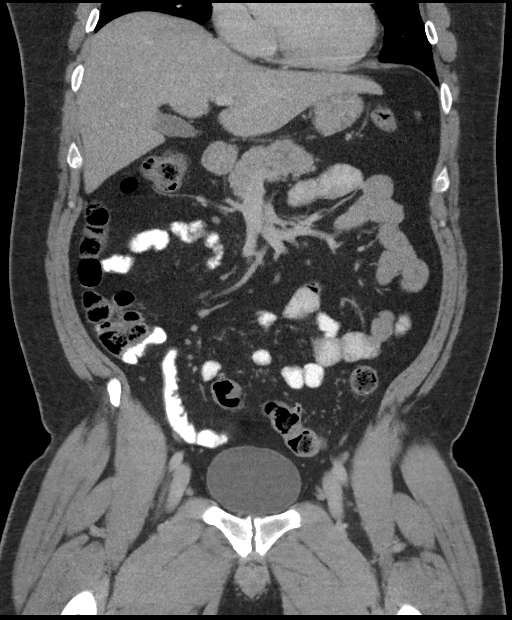
[im 65/117  soft-tissue]
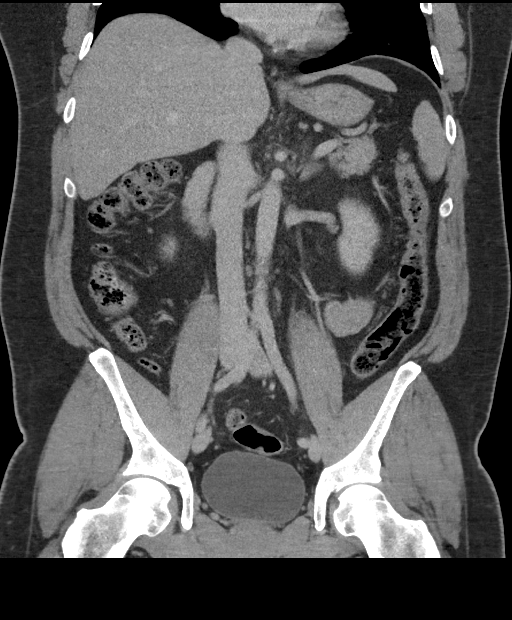

[15 of 46 positions shown; findings below may reference images not displayed]

FINDINGS: Lower chest: No worrisome lung base abnormality. Heart size within
normal limits.

Hepatobiliary: Suspect fatty infiltration liver. No focal hepatic
lesion noted. No calcified gallstone or CT evidence of gallbladder
inflammation.

Pancreas: No worrisome pancreatic mass or inflammation.

Spleen: No splenic mass or enlargement.

Adrenals/Urinary Tract: No obstructing stone or hydronephrosis. Tiny
nonobstructing right renal calculi. Right renal subcentimeter
low-density structure too small to characterize although
statistically likely a cyst.

3.5 x 3 x 3.5 cm left adrenal low-density lesion with Hounsfield
units less than 10 and relatively similar size to prior exam most
consistent with an adenoma. No right adrenal lesion.

Noncontrast filled imaging of the urinary bladder unremarkable.

Stomach/Bowel: No inflammation surrounds the appendix. Few scattered
sigmoid diverticula without extraluminal bowel inflammatory process.
No inflammation of the terminal ileum. Portions of colon under
distended limiting evaluation for detection of a mass. No obvious
mass noted.

Stomach is under distended. No gross abnormality noted. No small
bowel abnormality detected.

Vascular/Lymphatic: No abdominal aortic aneurysm or large vessel
occlusion. Scattered small lymph nodes without adenopathy.

Reproductive: No worrisome abnormality

Other: No free air or bowel containing hernia.

Musculoskeletal: Mild degenerative changes lower thoracic spine
moderate degenerative changes L4-5 and L5-S1. No osseous destructive
lesion.
IMPRESSION: 1. No evidence of appendicitis.
2. Few scattered sigmoid diverticula without extraluminal bowel
inflammation.
3. 3.5 x 3 x 3.5 cm left adrenal low-density lesion has
characteristics suggestive of an adenoma and without significant
change since compared with 04/24/2008 CT. Cannot determine if this
is a functioning adenoma by CT, requiring clinical and laboratory
correlation.
4. No obstructing renal or ureteral calculus. No hydronephrosis.
Tiny nonobstructing right renal calculus and subcentimeter right
renal low-density structure too small to characterize although
statistically likely cysts.
5. Moderate degenerative changes L4-5 and L5-S1.

## 2018-12-04 ENCOUNTER — Other Ambulatory Visit: Payer: Self-pay | Admitting: Family Medicine

## 2018-12-07 ENCOUNTER — Other Ambulatory Visit: Payer: Self-pay

## 2018-12-07 ENCOUNTER — Encounter: Payer: Self-pay | Admitting: Family Medicine

## 2018-12-07 ENCOUNTER — Ambulatory Visit (INDEPENDENT_AMBULATORY_CARE_PROVIDER_SITE_OTHER): Payer: BLUE CROSS/BLUE SHIELD | Admitting: Family Medicine

## 2018-12-07 DIAGNOSIS — J0101 Acute recurrent maxillary sinusitis: Secondary | ICD-10-CM | POA: Diagnosis not present

## 2018-12-07 MED ORDER — PREDNISONE 20 MG PO TABS: ORAL_TABLET | ORAL | 0 refills | Status: DC

## 2018-12-07 MED ORDER — DOXYCYCLINE HYCLATE 100 MG PO TABS: 100.0000 mg | ORAL_TABLET | Freq: Two times a day (BID) | ORAL | 0 refills | Status: DC

## 2018-12-07 NOTE — Progress Notes (Signed)
Virtual Visit via telephone Note  I connected with [name] on 12/07/18 at 0900 by telephone and verified that I am speaking with the correct person using two identifiers. [name] is currently located at home and no one is currently with them during visit. The provider, Kari Baars, FNP is located in their office at time of visit.  I discussed the limitations, risks, security and privacy concerns of performing an evaluation and management service by telephone and the availability of in person appointments. I also discussed with the patient that there may be a patient responsible charge related to this service. The patient expressed understanding and agreed to proceed.  Subjective:  Patient ID: [name], male    DOB: 17-Aug-1981, 38 y.o.   MRN: [id number]  Chief Complaint:  Sinus pressure and drainage  HPI: [name] is a 38 y.o. male presenting on 12/07/2018 for sinus pressure and drainage.  Pt reports recurrent sinus pressure, drainage, and headache. States this has been ongoing for a while. States worse over the last several days. States he was supposed to have surgery on the 19th, but that has been rescheduled. [t states she has chills, but has not measured his temperature. States the pressure and headache is 8/10. Pt states he has not been using his Flonase as prescribed. No cough, chest pain, or shortness of breath. No recent travel or known sick exposures.    Relevant past medical, surgical, family, and social history reviewed and updated as indicated.  Allergies and medications reviewed and updated.   Past Medical History:  Diagnosis Date  . Back pain, chronic   . Diverticulosis     Past Surgical History:  Procedure Laterality Date  . BACK SURGERY      Social History   Socioeconomic History  . Marital status: Married    Spouse name: Not on file  . Number of children: Not on file  . Years of education: Not on file  . Highest education level: Not on file   Occupational History  . Not on file  Social Needs  . Financial resource strain: Not on file  . Food insecurity:    Worry: Not on file    Inability: Not on file  . Transportation needs:    Medical: Not on file    Non-medical: Not on file  Tobacco Use  . Smoking status: Never Smoker  . Smokeless tobacco: Never Used  Substance and Sexual Activity  . Alcohol use: Yes    Comment: occasionally  . Drug use: No  . Sexual activity: Not on file  Lifestyle  . Physical activity:    Days per week: Not on file    Minutes per session: Not on file  . Stress: Not on file  Relationships  . Social connections:    Talks on phone: Not on file    Gets together: Not on file    Attends religious service: Not on file    Active member of club or organization: Not on file    Attends meetings of clubs or organizations: Not on file    Relationship status: Not on file  . Intimate partner violence:    Fear of current or ex partner: Not on file    Emotionally abused: Not on file    Physically abused: Not on file    Forced sexual activity: Not on file  Other Topics Concern  . Not on file  Social History Narrative  . Not on file  Outpatient Encounter Medications as of 12/07/2018  Medication Sig  . doxycycline (VIBRA-TABS) 100 MG tablet Take 1 tablet (100 mg total) by mouth 2 (two) times daily for 10 days. 1 po bid  . DULoxetine (CYMBALTA) 60 MG capsule Take 1 capsule (60 mg total) by mouth daily.  . fluticasone (FLONASE) 50 MCG/ACT nasal spray Place 2 sprays into the nose daily.  Marland Kitchen gabapentin (NEURONTIN) 600 MG tablet Take 2 tablets (1,200 mg total) by mouth 2 (two) times daily.  Marland Kitchen levofloxacin (LEVAQUIN) 500 MG tablet Take 1 tablet (500 mg total) by mouth daily. For 10 days  . mirtazapine (REMERON) 15 MG tablet Take 1 tablet (15 mg total) by mouth at bedtime.  . pantoprazole (PROTONIX) 40 MG tablet Take 1 tablet (40 mg total) by mouth daily. For stomach  . predniSONE (DELTASONE) 20 MG tablet 2  po at sametime daily for 5 days  . Pseudoephedrine-Guaifenesin [phone] MG TB12 Take 1 tablet by mouth 2 (two) times daily. For congestion  . triamcinolone cream (KENALOG) 0.1 % Apply 1 application topically 3 (three) times daily.   No facility-administered encounter medications on file as of 12/07/2018.     Allergies  Allergen Reactions  . Bee Venom Anaphylaxis  . Honey Anaphylaxis and Hives  . Penicillins Anaphylaxis    Review of Systems  Constitutional: Positive for chills and fatigue. Negative for fever.  HENT: Positive for congestion, postnasal drip, rhinorrhea, sinus pressure, sinus pain and sore throat. Negative for sneezing, tinnitus, trouble swallowing and voice change.   Respiratory: Negative for cough and shortness of breath.   Cardiovascular: Negative for chest pain and palpitations.  Neurological: Positive for headaches. Negative for dizziness, weakness and light-headedness.  Psychiatric/Behavioral: Negative for confusion.  All other systems reviewed and are negative.        Observations/Objective: Pt alert and oriented, answers all questions appropriately, and able to speak in full sentences.   Assessment and Plan: Elam was seen today for sinus problem.  Diagnoses and all orders for this visit:  Acute recurrent maxillary sinusitis Start using Flonase as prescribed. Increase fluid intake. Medications as prescribed. Report any new or worsening symptoms.  -     doxycycline (VIBRA-TABS) 100 MG tablet; Take 1 tablet (100 mg total) by mouth 2 (two) times daily for 10 days. 1 po bid -     predniSONE (DELTASONE) 20 MG tablet; 2 po at sametime daily for 5 days     Follow Up Instructions: Report any new or worsening symptoms.     I discussed the assessment and treatment plan with the patient. The patient was provided an opportunity to ask questions and all were answered. The patient agreed with the plan and demonstrated an understanding of the instructions.   The  patient was advised to call back or seek an in-person evaluation if the symptoms worsen or if the condition fails to improve as anticipated.  The above assessment and management plan was discussed with the patient. The patient verbalized understanding of and has agreed to the management plan. Patient is aware to call the clinic if symptoms persist or worsen. Patient is aware when to return to the clinic for a follow-up visit. Patient educated on when it is appropriate to go to the emergency department.    I provided 11 minutes of non-face-to-face time during this encounter. The call started at 0900. The call ended at 0911.   Kari Baars, FNP-C [facility] [address] [place], [place] [address] [phone]

## 2018-12-11 ENCOUNTER — Telehealth: Payer: Self-pay | Admitting: Family Medicine

## 2018-12-11 ENCOUNTER — Other Ambulatory Visit: Payer: Self-pay | Admitting: Family Medicine

## 2018-12-11 MED ORDER — PREDNISONE 10 MG PO TABS: ORAL_TABLET | ORAL | 0 refills | Status: DC

## 2018-12-11 MED ORDER — LEVOFLOXACIN 500 MG PO TABS: 500.0000 mg | ORAL_TABLET | Freq: Every day | ORAL | 0 refills | Status: DC

## 2018-12-11 NOTE — Telephone Encounter (Signed)
Pt aware rx sent in and he requested work note for today and he is off tomorrow, next scheduled work day is Wednesday. Pt wanted work note sent to [employer]. Note written and sent via MyChart.

## 2018-12-11 NOTE — Telephone Encounter (Signed)
Pt states his sinus drainage has gotten worse and so has the headache. He has been taking doxy and the prednisone but wants to know if there is something else. Please advise.

## 2018-12-11 NOTE — Telephone Encounter (Signed)
I sent in the requested prescription 

## 2018-12-13 ENCOUNTER — Encounter: Payer: Self-pay | Admitting: *Deleted

## 2018-12-13 ENCOUNTER — Other Ambulatory Visit: Payer: Self-pay | Admitting: Family Medicine

## 2018-12-13 ENCOUNTER — Telehealth: Payer: Self-pay | Admitting: Family Medicine

## 2018-12-13 ENCOUNTER — Ambulatory Visit: Payer: BLUE CROSS/BLUE SHIELD | Admitting: Family Medicine

## 2018-12-13 ENCOUNTER — Telehealth: Payer: Self-pay | Admitting: *Deleted

## 2018-12-13 MED ORDER — METHYLPREDNISOLONE 8 MG PO TABS: ORAL_TABLET | ORAL | 0 refills | Status: DC

## 2018-12-13 NOTE — Telephone Encounter (Signed)
Pt states he was given prednisone and every time he takes it he throws up. First time he took it was after dinner went to bed then throw up. Then he took it again this morning and throw up as well. Takes with food. Patient denies diarrhea. Pt is nauseated after he takes the med. Pt uses [facility] in [place].

## 2018-12-13 NOTE — Telephone Encounter (Signed)
Have him come in for a nurse vist for celestone 1 ml, IM. DC prednisone. WS

## 2018-12-13 NOTE — Telephone Encounter (Signed)
Pt states he is still having sinus drainage, nasal congestion, headache that comes and goes, coughing, sneezing and doesn't feel like he is feverish but hasn't been able to take his temp. Pt has appt scheduled at 4:10 today to see Dr Darlyn Read.

## 2018-12-19 DIAGNOSIS — Z029 Encounter for administrative examinations, unspecified: Secondary | ICD-10-CM

## 2019-01-22 ENCOUNTER — Encounter: Payer: BLUE CROSS/BLUE SHIELD | Admitting: Family Medicine

## 2019-01-29 DIAGNOSIS — Z1159 Encounter for screening for other viral diseases: Secondary | ICD-10-CM | POA: Diagnosis not present

## 2019-02-01 DIAGNOSIS — J342 Deviated nasal septum: Secondary | ICD-10-CM | POA: Diagnosis not present

## 2019-02-01 DIAGNOSIS — J343 Hypertrophy of nasal turbinates: Secondary | ICD-10-CM | POA: Diagnosis not present

## 2019-04-13 NOTE — Telephone Encounter (Signed)
x

## 2019-09-17 ENCOUNTER — Encounter (HOSPITAL_COMMUNITY): Payer: Self-pay | Admitting: Emergency Medicine

## 2019-09-17 ENCOUNTER — Other Ambulatory Visit: Payer: Self-pay

## 2019-09-17 ENCOUNTER — Emergency Department (HOSPITAL_COMMUNITY)
Admission: EM | Admit: 2019-09-17 | Discharge: 2019-09-17 | Disposition: A | Discharge status: Home / Self Care | Payer: BC Managed Care – PPO | Attending: Emergency Medicine | Admitting: Emergency Medicine

## 2019-09-17 DIAGNOSIS — Z5321 Procedure and treatment not carried out due to patient leaving prior to being seen by health care provider: Secondary | ICD-10-CM | POA: Insufficient documentation

## 2019-09-17 DIAGNOSIS — M545 Low back pain: Secondary | ICD-10-CM | POA: Insufficient documentation

## 2019-09-17 NOTE — ED Triage Notes (Signed)
Low back pain that started yesterday.  Pt has hx of chronic low back pain

## 2019-09-17 NOTE — ED Notes (Signed)
Pt told registration he will go home and suffer, then threw his hospital bracelet on the floor while leaving.

## 2020-11-17 ENCOUNTER — Encounter (HOSPITAL_COMMUNITY): Payer: Self-pay | Admitting: *Deleted

## 2020-11-17 ENCOUNTER — Other Ambulatory Visit: Payer: Self-pay

## 2020-11-17 ENCOUNTER — Emergency Department (HOSPITAL_COMMUNITY)
Admission: EM | Admit: 2020-11-17 | Discharge: 2020-11-17 | Disposition: A | Discharge status: Home / Self Care | Payer: HRSA Program | Attending: Emergency Medicine | Admitting: Emergency Medicine

## 2020-11-17 DIAGNOSIS — R0981 Nasal congestion: Secondary | ICD-10-CM | POA: Insufficient documentation

## 2020-11-17 DIAGNOSIS — R112 Nausea with vomiting, unspecified: Secondary | ICD-10-CM | POA: Insufficient documentation

## 2020-11-17 DIAGNOSIS — R109 Unspecified abdominal pain: Secondary | ICD-10-CM | POA: Diagnosis present

## 2020-11-17 DIAGNOSIS — R630 Anorexia: Secondary | ICD-10-CM | POA: Insufficient documentation

## 2020-11-17 DIAGNOSIS — U071 COVID-19: Secondary | ICD-10-CM | POA: Insufficient documentation

## 2020-11-17 LAB — RESP PANEL BY RT-PCR (FLU A&B, COVID) ARPGX2
Influenza A by PCR: NEGATIVE
Influenza B by PCR: NEGATIVE
SARS Coronavirus 2 by RT PCR: POSITIVE — AB

## 2020-11-17 LAB — CBC WITH DIFFERENTIAL/PLATELET
Abs Immature Granulocytes: 0.02 10*3/uL (ref 0.00–0.07)
Basophils Absolute: 0 10*3/uL (ref 0.0–0.1)
Basophils Relative: 1 %
Eosinophils Absolute: 0.3 10*3/uL (ref 0.0–0.5)
Eosinophils Relative: 6 %
HCT: 50 % (ref 39.0–52.0)
Hemoglobin: 16.4 g/dL (ref 13.0–17.0)
Immature Granulocytes: 0 %
Lymphocytes Relative: 28 %
Lymphs Abs: 1.6 10*3/uL (ref 0.7–4.0)
MCH: 28.6 pg (ref 26.0–34.0)
MCHC: 32.8 g/dL (ref 30.0–36.0)
MCV: 87.3 fL (ref 80.0–100.0)
Monocytes Absolute: 0.2 10*3/uL (ref 0.1–1.0)
Monocytes Relative: 4 %
Neutro Abs: 3.4 10*3/uL (ref 1.7–7.7)
Neutrophils Relative %: 61 %
Platelets: 211 10*3/uL (ref 150–400)
RBC: 5.73 MIL/uL (ref 4.22–5.81)
RDW: 12.8 % (ref 11.5–15.5)
WBC: 5.6 10*3/uL (ref 4.0–10.5)
nRBC: 0 % (ref 0.0–0.2)

## 2020-11-17 LAB — COMPREHENSIVE METABOLIC PANEL
ALT: 18 U/L (ref 0–44)
AST: 17 U/L (ref 15–41)
Albumin: 4 g/dL (ref 3.5–5.0)
Alkaline Phosphatase: 61 U/L (ref 38–126)
Anion gap: 8 (ref 5–15)
BUN: 11 mg/dL (ref 6–20)
CO2: 24 mmol/L (ref 22–32)
Calcium: 9 mg/dL (ref 8.9–10.3)
Chloride: 107 mmol/L (ref 98–111)
Creatinine, Ser: 0.82 mg/dL (ref 0.61–1.24)
GFR, Estimated: >60 mL/min (ref >60)
Glucose, Bld: 102 mg/dL — ABNORMAL HIGH (ref 70–99)
Potassium: 4.4 mmol/L (ref 3.5–5.1)
Sodium: 139 mmol/L (ref 135–145)
Total Bilirubin: 0.3 mg/dL (ref 0.3–1.2)
Total Protein: 6.8 g/dL (ref 6.5–8.1)

## 2020-11-17 LAB — POC SARS CORONAVIRUS 2 AG -  ED: SARS Coronavirus 2 Ag: NEGATIVE

## 2020-11-17 LAB — LIPASE, BLOOD: Lipase: 41 U/L (ref 11–51)

## 2020-11-17 MED ORDER — SODIUM CHLORIDE 0.9 % IV BOLUS
500.0000 mL | Freq: Once | INTRAVENOUS | Status: AC
  Administered 2020-11-17: 500 mL via INTRAVENOUS

## 2020-11-17 MED ORDER — ONDANSETRON HCL 4 MG/2ML IJ SOLN
4.0000 mg | Freq: Once | INTRAMUSCULAR | Status: AC
  Administered 2020-11-17: 4 mg via INTRAVENOUS
  Filled 2020-11-17: qty 2

## 2020-11-17 MED ORDER — METRONIDAZOLE 500 MG PO TABS: 500.0000 mg | ORAL_TABLET | Freq: Three times a day (TID) | ORAL | 0 refills | Status: AC

## 2020-11-17 MED ORDER — CIPROFLOXACIN HCL 500 MG PO TABS: 500.0000 mg | ORAL_TABLET | Freq: Two times a day (BID) | ORAL | 0 refills | Status: AC

## 2020-11-17 MED ORDER — KETOROLAC TROMETHAMINE 30 MG/ML IJ SOLN
30.0000 mg | Freq: Once | INTRAMUSCULAR | Status: AC
  Administered 2020-11-17: 30 mg via INTRAVENOUS
  Filled 2020-11-17: qty 1

## 2020-11-17 MED ORDER — DICYCLOMINE HCL 20 MG PO TABS: 20.0000 mg | ORAL_TABLET | Freq: Two times a day (BID) | ORAL | 0 refills | Status: AC | PRN

## 2020-11-17 MED ORDER — ONDANSETRON 4 MG PO TBDP: 4.0000 mg | ORAL_TABLET | Freq: Three times a day (TID) | ORAL | 0 refills | Status: AC | PRN

## 2020-11-17 NOTE — ED Triage Notes (Addendum)
Pt c/o vomiting, diarrhea, lower abdominal pain since Saturday. Pt reports fever started yesterday and was 101. Pt has had exposure to Covid at work. Denies urinary symptoms. Hx of diverticulosis, but pt reports this pain is much worse than in the past.

## 2020-11-17 NOTE — ED Provider Notes (Signed)
I called and updated patient about Covid positive results.  Advised quarantine and NOT starting the antibiotics.  He verbalized understanding.   Terald Sleeper, MD 11/17/20 1150

## 2020-11-17 NOTE — Discharge Instructions (Addendum)
If your covid test is negative tomorrow, you should start taking two antibiotics called Ciprofloxacin and Flagyl to treat for colitis. Your blood tests were reassuring today.  Do not drink alcohol while taking these antibiotics.  You will get very sick.

## 2020-11-17 NOTE — ED Provider Notes (Signed)
[facility] EMERGENCY DEPARTMENT Provider Note   CSN: [id number] Arrival date & time: 11/17/20  0801     History Chief Complaint  Patient presents with  . Emesis    [name] is a 40 y.o. male with history of diverticulosis presenting emergency department with abdominal pain, nausea and diarrhea.  The patient reports that he began feeling generally unwell on Friday, had a cough and congestion at that time.  He is unvaccinated for Covid and is concerned he was exposed to someone with positive for Covid at work.  He states that 2 days ago he began having diffuse lower abdominal pain, as well as nausea, poor appetite, and diarrhea.  He feels her been streaks of blood in his diarrhea for the past 24 hours.  He feels like he had bowel movements 15 times over the past 3 days.  He says the pain felt similar to "my diverticulitis pain" in the past, with the symptoms usually resolve after a day, this is more persistent.  He has never seen a GI doctor.  He has not been on antibiotics recently.  No hx of C Diff.  No hx of abdominal surgery.  HPI     Past Medical History:  Diagnosis Date  . Back pain, chronic   . Diverticulosis     Patient Active Problem List   Diagnosis Date Noted  . Latent syphilis, unspecified as early or late 08/21/2018    Past Surgical History:  Procedure Laterality Date  . BACK SURGERY         Family History  Problem Relation Age of Onset  . Diabetes Other     Social History   Tobacco Use  . Smoking status: Never Smoker  . Smokeless tobacco: Never Used  Vaping Use  . Vaping Use: Never used  Substance Use Topics  . Alcohol use: Yes    Comment: occasionally  . Drug use: No    Home Medications Prior to Admission medications   Medication Sig Start Date End Date Taking? Authorizing Provider  acetaminophen (TYLENOL) 500 MG tablet Take 500 mg by mouth every 6 (six) hours as needed.   Yes [provider]  ciprofloxacin (CIPRO) 500 MG tablet  Take 1 tablet (500 mg total) by mouth 2 (two) times daily for 7 days. 11/17/20 11/24/20 Yes Khloey Chern, Kermit BaloMatthew J, MD  dicyclomine (BENTYL) 20 MG tablet Take 1 tablet (20 mg total) by mouth 2 (two) times daily as needed for up to 20 doses for spasms. 11/17/20  Yes Kaiyah Eber, Kermit BaloMatthew J, MD  metroNIDAZOLE (FLAGYL) 500 MG tablet Take 1 tablet (500 mg total) by mouth 3 (three) times daily for 7 days. 11/17/20 11/24/20 Yes Osinachi Navarrette, Kermit BaloMatthew J, MD  ondansetron (ZOFRAN ODT) 4 MG disintegrating tablet Take 1 tablet (4 mg total) by mouth every 8 (eight) hours as needed for up to 12 doses for nausea or vomiting. 11/17/20  Yes Terald Sleeperrifan, Talayah Picardi J, MD  DULoxetine (CYMBALTA) 60 MG capsule Take 1 capsule (60 mg total) by mouth daily. Patient not taking: Reported on 11/17/2020 09/29/18   Mechele ClaudeStacks, Warren, MD  gabapentin (NEURONTIN) 600 MG tablet Take 2 tablets (1,200 mg total) by mouth 2 (two) times daily. Patient not taking: Reported on 11/17/2020 09/29/18   Mechele ClaudeStacks, Warren, MD  levofloxacin (LEVAQUIN) 500 MG tablet Take 1 tablet (500 mg total) by mouth daily. For 10 days Patient not taking: No sig reported 12/11/18   Mechele ClaudeStacks, Warren, MD  methylPREDNISolone (MEDROL) 8 MG tablet 4 daily for 2 days,  then 3 daily for 2 days, then 2 daily for 2 days, then 1 daily for 2 days Patient not taking: Reported on 11/17/2020 12/13/18   Mechele Claude, MD  mirtazapine (REMERON) 15 MG tablet Take 1 tablet (15 mg total) by mouth at bedtime. Patient not taking: Reported on 11/17/2020 09/29/18   Mechele Claude, MD  pantoprazole (PROTONIX) 40 MG tablet Take 1 tablet (40 mg total) by mouth daily. For stomach Patient not taking: Reported on 11/17/2020 07/21/18   Mechele Claude, MD  Pseudoephedrine-Guaifenesin [phone] MG TB12 Take 1 tablet by mouth 2 (two) times daily. For congestion Patient not taking: Reported on 11/17/2020 09/29/18   Mechele Claude, MD  triamcinolone cream (KENALOG) 0.1 % Apply 1 application topically 3 (three) times daily. Patient not taking: No sig  reported 02/17/18   Mechele Claude, MD    Allergies    Bee venom, Honey, and Penicillins  Review of Systems   Review of Systems  Constitutional: Positive for appetite change, chills, fatigue and fever.  HENT: Negative for ear pain and sore throat.   Eyes: Negative for pain and visual disturbance.  Respiratory: Negative for cough and shortness of breath.   Cardiovascular: Negative for chest pain and palpitations.  Gastrointestinal: Positive for abdominal pain, blood in stool, diarrhea, nausea and vomiting.  Genitourinary: Negative for difficulty urinating and dysuria.  Musculoskeletal: Negative for arthralgias and back pain.  Skin: Negative for color change and rash.  Neurological: Negative for syncope and light-headedness.  All other systems reviewed and are negative.   Physical Exam Updated Vital Signs BP 117/83   Pulse (!) 57   Temp 97.6 F (36.4 C) (Oral)   Resp 16   Ht 5\' 10"  (1.778 m)   Wt 97.5 kg   SpO2 98%   BMI 30.85 kg/m   Physical Exam Constitutional:      General: He is not in acute distress. HENT:     Head: Normocephalic and atraumatic.  Eyes:     Conjunctiva/sclera: Conjunctivae normal.     Pupils: Pupils are equal, round, and reactive to light.  Cardiovascular:     Rate and Rhythm: Normal rate and regular rhythm.  Pulmonary:     Effort: Pulmonary effort is normal. No respiratory distress.  Abdominal:     General: There is no distension.     Tenderness: There is generalized abdominal tenderness. There is no guarding or rebound. Negative signs include Murphy's sign.  Skin:    General: Skin is warm and dry.  Neurological:     General: No focal deficit present.     Mental Status: He is alert. Mental status is at baseline.  Psychiatric:        Mood and Affect: Mood normal.        Behavior: Behavior normal.     ED Results / Procedures / Treatments   Labs (all labs ordered are listed, but only abnormal results are displayed) Labs Reviewed  RESP  PANEL BY RT-PCR (FLU A&B, COVID) ARPGX2 - Abnormal; Notable for the following components:      Result Value   SARS Coronavirus 2 by RT PCR POSITIVE (*)    All other components within normal limits  COMPREHENSIVE METABOLIC PANEL - Abnormal; Notable for the following components:   Glucose, Bld 102 (*)    All other components within normal limits  SARS CORONAVIRUS 2 (TAT 6-24 HRS)  CBC WITH DIFFERENTIAL/PLATELET  LIPASE, BLOOD  URINALYSIS, ROUTINE W REFLEX MICROSCOPIC  POC SARS CORONAVIRUS 2 AG -  ED  EKG None  Radiology No results found.  Procedures Procedures   Medications Ordered in ED Medications  sodium chloride 0.9 % bolus 500 mL (0 mLs Intravenous Stopped 11/17/20 1013)  ondansetron (ZOFRAN) injection 4 mg (4 mg Intravenous Given 11/17/20 0925)  ketorolac (TORADOL) 30 MG/ML injection 30 mg (30 mg Intravenous Given 11/17/20 1856)    ED Course  I have reviewed the triage vital signs and the nursing notes.  Pertinent labs & imaging results that were available during my care of the patient were reviewed by me and considered in my medical decision making (see chart for details).  This patient presents to the Emergency Department with complaint of abdominal pain. This involves an extensive number of treatment options, and is a complaint that carries with it a high risk of complications and morbidity.  The differential diagnosis includes gastritis vs colitis/diverticulitis vs inflammatory bowel disease vs intraabdominal infection vs UTI vs other  Also may be viral enteritis related to covid - airborne precautions, rapid covid test  I ordered, reviewed, and interpreted labs,including WBC normal, hgb normal, CMP unremarkable, lipase 14.  Highly doubtful of anemia, dehydration, or significant bacterial infection I ordered medication IV toradol, IV zofran for abdominal pain and/or nausea  After the interventions stated above, I reevaluated the patient and found that they remained  clinically stable, feeling better.    Based on the patient's clinical exam, vital signs, risk factors, and ED testing, I felt that the patient's overall risk of life-threatening emergency such as bowel perforation, surgical emergency, or sepsis was quite low.  I suspect this clinical presentation is most consistent with viral enteritis, but explained to the patient that this evaluation was not a definitive diagnostic workup.  I discussed outpatient follow up with primary care provider, and provided specialist office number on the patient's discharge paper if a referral was deemed necessary.  I discussed return precautions with the patient. I felt the patient was clinically stable for discharge.   Clinical Course as of 11/17/20 1141  Mon Nov 17, 2020  1014 Labs are benign.  No fever, no leukocytosis, no tachycardia suspect worsening infection or bacterial inflammation.  His pain is better on reassessment.  I doubt this is acute appendicitis at this time.  He is not insured and prefer to keep his ED cost down, and I think avoiding a CT scan would be reasonable at this point.  We will send a Covid PCR test is still have a high clinical suspicion that this may be a viral infection and possibly Covid.  I will also prescribe him a few medications to keep at home, including watch and wait antibiotics for diverticulitis. [MT]    Clinical Course User Index [MT] Terald Sleeper, MD    Final Clinical Impression(s) / ED Diagnoses Final diagnoses:  Abdominal pain, unspecified abdominal location    Rx / DC Orders ED Discharge Orders         Ordered    ciprofloxacin (CIPRO) 500 MG tablet  2 times daily        11/17/20 1018    metroNIDAZOLE (FLAGYL) 500 MG tablet  3 times daily        11/17/20 1018    dicyclomine (BENTYL) 20 MG tablet  2 times daily PRN        11/17/20 1019    ondansetron (ZOFRAN ODT) 4 MG disintegrating tablet  Every 8 hours PRN        11/17/20 1019  Terald Sleeper, MD 11/17/20 1141

## 2020-11-18 ENCOUNTER — Telehealth: Payer: Self-pay

## 2020-11-18 NOTE — Telephone Encounter (Signed)
Called to discuss with patient about COVID-19 symptoms and the use of one of the available treatments for those with mild to moderate Covid symptoms and at a high risk of hospitalization.  Pt appears to qualify for outpatient treatment due to co-morbid conditions and/or a member of an at-risk group in accordance with the FDA Emergency Use Authorization.    Symptom onset: 11/14/20 Vaccinated: No Booster? No Immunocompromised? No Qualifiers: Obesity  Declines any further treatment.  [name]

## 2020-12-24 ENCOUNTER — Other Ambulatory Visit: Payer: Self-pay

## 2020-12-24 ENCOUNTER — Emergency Department (HOSPITAL_COMMUNITY)
Admission: EM | Admit: 2020-12-24 | Discharge: 2020-12-24 | Disposition: A | Discharge status: Home / Self Care | Payer: Self-pay | Attending: Emergency Medicine | Admitting: Emergency Medicine

## 2020-12-24 ENCOUNTER — Encounter (HOSPITAL_COMMUNITY): Payer: Self-pay | Admitting: Emergency Medicine

## 2020-12-24 DIAGNOSIS — L237 Allergic contact dermatitis due to plants, except food: Secondary | ICD-10-CM

## 2020-12-24 DIAGNOSIS — L255 Unspecified contact dermatitis due to plants, except food: Secondary | ICD-10-CM | POA: Insufficient documentation

## 2020-12-24 MED ORDER — PREDNISONE 20 MG PO TABS: 40.0000 mg | ORAL_TABLET | Freq: Every day | ORAL | 0 refills | Status: AC

## 2020-12-24 MED ORDER — DEXAMETHASONE SODIUM PHOSPHATE 10 MG/ML IJ SOLN
10.0000 mg | Freq: Once | INTRAMUSCULAR | Status: AC
  Administered 2020-12-24: 10 mg via INTRAMUSCULAR
  Filled 2020-12-24: qty 1

## 2020-12-24 MED ORDER — TRIAMCINOLONE ACETONIDE 0.1 % EX CREA: 1.0000 "application " | TOPICAL_CREAM | Freq: Two times a day (BID) | CUTANEOUS | 0 refills | Status: AC

## 2020-12-24 NOTE — ED Provider Notes (Signed)
[facility] EMERGENCY DEPARTMENT Provider Note   CSN: [id number] Arrival date & time: 12/24/20  1747     History Chief Complaint  Patient presents with  . Rash    [name] is a 40 y.o. male.  HPI   Different chronic medical problems, he does not take any daily medications, he presents to the hospital today with a complaint of rash and itching to his bilateral arms and some of his trunk.  On Saturday he was doing yard work all day long, near the end of the day started to have some itching and by the next morning he had had a vesicular linear rash that had broken out across his bilateral arms from his mid upper arm down through his wrists.  It also spread onto his waistline, there is no involvement of the genitals, the back, the upper chest, the legs or the face.  He has been using calamine lotion and Benadryl without significant relief.  It is getting worse over the last 4 days.  He did have poison ivy and poison oak as a child frequently  Past Medical History:  Diagnosis Date  . Back pain, chronic   . Diverticulosis     Patient Active Problem List   Diagnosis Date Noted  . Latent syphilis, unspecified as early or late 08/21/2018    Past Surgical History:  Procedure Laterality Date  . BACK SURGERY         Family History  Problem Relation Age of Onset  . Diabetes Other     Social History   Tobacco Use  . Smoking status: Never Smoker  . Smokeless tobacco: Never Used  Vaping Use  . Vaping Use: Never used  Substance Use Topics  . Alcohol use: Yes    Comment: occasionally  . Drug use: No    Home Medications Prior to Admission medications   Medication Sig Start Date End Date Taking? Authorizing Provider  acetaminophen (TYLENOL) 500 MG tablet Take 500 mg by mouth every 6 (six) hours as needed.    [provider]  dicyclomine (BENTYL) 20 MG tablet Take 1 tablet (20 mg total) by mouth 2 (two) times daily as needed for up to 20 doses for spasms. 11/17/20    Terald Sleeper, MD  DULoxetine (CYMBALTA) 60 MG capsule Take 1 capsule (60 mg total) by mouth daily. Patient not taking: Reported on 11/17/2020 09/29/18   Mechele Claude, MD  gabapentin (NEURONTIN) 600 MG tablet Take 2 tablets (1,200 mg total) by mouth 2 (two) times daily. Patient not taking: Reported on 11/17/2020 09/29/18   Mechele Claude, MD  levofloxacin (LEVAQUIN) 500 MG tablet Take 1 tablet (500 mg total) by mouth daily. For 10 days Patient not taking: No sig reported 12/11/18   Mechele Claude, MD  methylPREDNISolone (MEDROL) 8 MG tablet 4 daily for 2 days, then 3 daily for 2 days, then 2 daily for 2 days, then 1 daily for 2 days Patient not taking: Reported on 11/17/2020 12/13/18   Mechele Claude, MD  mirtazapine (REMERON) 15 MG tablet Take 1 tablet (15 mg total) by mouth at bedtime. Patient not taking: Reported on 11/17/2020 09/29/18   Mechele Claude, MD  ondansetron (ZOFRAN ODT) 4 MG disintegrating tablet Take 1 tablet (4 mg total) by mouth every 8 (eight) hours as needed for up to 12 doses for nausea or vomiting. 11/17/20   Terald Sleeper, MD  pantoprazole (PROTONIX) 40 MG tablet Take 1 tablet (40 mg total) by mouth daily. For  stomach Patient not taking: Reported on 11/17/2020 07/21/18   Mechele Claude, MD  Pseudoephedrine-Guaifenesin [phone] MG TB12 Take 1 tablet by mouth 2 (two) times daily. For congestion Patient not taking: Reported on 11/17/2020 09/29/18   Mechele Claude, MD  triamcinolone cream (KENALOG) 0.1 % Apply 1 application topically 3 (three) times daily. Patient not taking: No sig reported 02/17/18   Mechele Claude, MD    Allergies    Bee venom, Honey, and Penicillins  Review of Systems   Review of Systems  Constitutional: Negative for fever.  Skin: Positive for rash.    Physical Exam Updated Vital Signs There were no vitals taken for this visit.  Physical Exam Vitals and nursing note reviewed.  Constitutional:      Appearance: He is well-developed. He is not diaphoretic.   HENT:     Head: Normocephalic and atraumatic.  Eyes:     General:        Right eye: No discharge.        Left eye: No discharge.     Conjunctiva/sclera: Conjunctivae normal.  Pulmonary:     Effort: Pulmonary effort is normal. No respiratory distress.  Skin:    General: Skin is warm and dry.     Findings: Rash present. No erythema.  Neurological:     Mental Status: He is alert.     Coordination: Coordination normal.     ED Results / Procedures / Treatments   Labs (all labs ordered are listed, but only abnormal results are displayed) Labs Reviewed - No data to display  EKG None  Radiology No results found.  Procedures Procedures   Medications Ordered in ED Medications  dexamethasone (DECADRON) injection 10 mg (has no administration in time range)    ED Course  I have reviewed the triage vital signs and the nursing notes.  Pertinent labs & imaging results that were available during my care of the patient were reviewed by me and considered in my medical decision making (see chart for details).    MDM Rules/Calculators/A&P                          Rashes covering the bilateral upper extremities from the distal upper extremity above the elbow through the wrist.  Hands are spared, legs are spared, linear vesicular erythematous lesions across the waistline bilaterally  Patient will be given shot of Decadron, home on prednisone and topical steroids, agreeable, no signs of cellulitis, no fever, well-appearing and agreeable to the plan  Final Clinical Impression(s) / ED Diagnoses Final diagnoses:  Poison ivy dermatitis    Rx / DC Orders ED Discharge Orders    None       Eber Hong, MD 12/24/20 1800

## 2020-12-24 NOTE — ED Triage Notes (Signed)
Itchy red Rash on arms, face, trunk since Sunday.  Pt did some yard work on Saturday.

## 2020-12-24 NOTE — Discharge Instructions (Signed)
Please return for increasing redness or swelling, increasing pain or fever.  Take prednisone daily for 14 days as prescribed, apply topical steroid ointment twice a day as needed for itching and rash.  Please avoid scratching as this may cause an infection.  See your doctor in 1 week if no better
